# Patient Record
Sex: Male | Born: 1960 | Race: White | Hispanic: No | Marital: Married | Smoking: Never smoker
Health system: Southern US, Community
[De-identification: ages and names within clinical notes are randomized; demographics above are authoritative.]

## PROBLEM LIST (undated history)

## (undated) DIAGNOSIS — F419 Anxiety disorder, unspecified: Secondary | ICD-10-CM

## (undated) DIAGNOSIS — I10 Essential (primary) hypertension: Secondary | ICD-10-CM

## (undated) DIAGNOSIS — M1990 Unspecified osteoarthritis, unspecified site: Secondary | ICD-10-CM

## (undated) DIAGNOSIS — F32A Depression, unspecified: Secondary | ICD-10-CM

## (undated) DIAGNOSIS — E782 Mixed hyperlipidemia: Secondary | ICD-10-CM

## (undated) DIAGNOSIS — Z87442 Personal history of urinary calculi: Secondary | ICD-10-CM

## (undated) DIAGNOSIS — C801 Malignant (primary) neoplasm, unspecified: Secondary | ICD-10-CM

## (undated) DIAGNOSIS — E119 Type 2 diabetes mellitus without complications: Secondary | ICD-10-CM

## (undated) DIAGNOSIS — F329 Major depressive disorder, single episode, unspecified: Secondary | ICD-10-CM

## (undated) DIAGNOSIS — K219 Gastro-esophageal reflux disease without esophagitis: Secondary | ICD-10-CM

## (undated) HISTORY — DX: Mixed hyperlipidemia: E78.2

## (undated) HISTORY — DX: Essential (primary) hypertension: I10

---

## 2000-06-14 HISTORY — PX: PARTIAL NEPHRECTOMY: SHX414

## 2002-07-31 ENCOUNTER — Inpatient Hospital Stay (HOSPITAL_COMMUNITY): Admission: EM | Admit: 2002-07-31 | Discharge: 2002-08-01 | Payer: Self-pay | Admitting: Cardiology

## 2006-06-14 HISTORY — PX: CHOLECYSTECTOMY: SHX55

## 2006-06-14 HISTORY — PX: SHOULDER SURGERY: SHX246

## 2009-03-07 ENCOUNTER — Ambulatory Visit: Payer: Self-pay | Admitting: Cardiovascular Disease

## 2009-06-14 DIAGNOSIS — E119 Type 2 diabetes mellitus without complications: Secondary | ICD-10-CM | POA: Insufficient documentation

## 2009-06-14 HISTORY — DX: Type 2 diabetes mellitus without complications: E11.9

## 2009-08-21 ENCOUNTER — Ambulatory Visit (HOSPITAL_COMMUNITY): Admission: RE | Admit: 2009-08-21 | Discharge: 2009-08-21 | Payer: Self-pay | Admitting: Urology

## 2010-01-08 ENCOUNTER — Ambulatory Visit (HOSPITAL_COMMUNITY): Admission: RE | Admit: 2010-01-08 | Discharge: 2010-01-08 | Payer: Self-pay | Admitting: Urology

## 2010-01-29 ENCOUNTER — Ambulatory Visit (HOSPITAL_COMMUNITY): Admission: RE | Admit: 2010-01-29 | Discharge: 2010-01-29 | Payer: Self-pay | Admitting: Urology

## 2010-03-30 DIAGNOSIS — F32A Depression, unspecified: Secondary | ICD-10-CM | POA: Insufficient documentation

## 2010-03-30 DIAGNOSIS — M47817 Spondylosis without myelopathy or radiculopathy, lumbosacral region: Secondary | ICD-10-CM | POA: Insufficient documentation

## 2010-03-30 DIAGNOSIS — F329 Major depressive disorder, single episode, unspecified: Secondary | ICD-10-CM | POA: Insufficient documentation

## 2010-03-30 HISTORY — DX: Spondylosis without myelopathy or radiculopathy, lumbosacral region: M47.817

## 2010-08-28 LAB — CBC
HCT: 36.2 % — ABNORMAL LOW (ref 39.0–52.0)
Hemoglobin: 12.6 g/dL — ABNORMAL LOW (ref 13.0–17.0)
MCH: 31.5 pg (ref 26.0–34.0)
MCHC: 34.7 g/dL (ref 30.0–36.0)
MCV: 90.7 fL (ref 78.0–100.0)
Platelets: 228 10*3/uL (ref 150–400)
RBC: 3.99 MIL/uL — ABNORMAL LOW (ref 4.22–5.81)
RDW: 12.1 % (ref 11.5–15.5)
WBC: 2.4 10*3/uL — ABNORMAL LOW (ref 4.0–10.5)

## 2010-08-28 LAB — PROTIME-INR
INR: 0.95 (ref 0.00–1.49)
Prothrombin Time: 12.9 seconds (ref 11.6–15.2)

## 2010-08-28 LAB — APTT: aPTT: 28 seconds (ref 24–37)

## 2010-08-29 LAB — CBC
Hemoglobin: 13.8 g/dL (ref 13.0–17.0)
Platelets: 281 10*3/uL (ref 150–400)
RBC: 4.29 MIL/uL (ref 4.22–5.81)
WBC: 5.3 10*3/uL (ref 4.0–10.5)

## 2010-08-29 LAB — APTT: aPTT: 29 seconds (ref 24–37)

## 2010-08-29 LAB — PROTIME-INR
INR: 1.01 (ref 0.00–1.49)
Prothrombin Time: 13.2 seconds (ref 11.6–15.2)

## 2010-08-29 LAB — GLUCOSE, CAPILLARY: Glucose-Capillary: 98 mg/dL (ref 70–99)

## 2010-09-07 LAB — PROTIME-INR
INR: 0.95 (ref 0.00–1.49)
Prothrombin Time: 12.6 seconds (ref 11.6–15.2)

## 2010-09-07 LAB — APTT: aPTT: 32 seconds (ref 24–37)

## 2010-09-07 LAB — CBC
HCT: 40.5 % (ref 39.0–52.0)
Platelets: 303 10*3/uL (ref 150–400)
RDW: 12.3 % (ref 11.5–15.5)

## 2010-10-27 NOTE — Assessment & Plan Note (Signed)
[facility]                        [facility] OFFICE NOTE   ZERICK, PREVETTE                         MRN:          [id number]  DATE:03/07/2009                            DOB:          12-Aug-1960    CHIEF COMPLAINT:  Chest tightness.   HISTORY OF PRESENT ILLNESS:  Mr. Frederick Martinez is a 50 year old white male with  past medical history significant for diabetes, hypertension,  gastroesophageal reflux disease, and family history of premature  coronary artery disease, who is presenting with chest discomfort x1  week.  The patient states that last Friday after coming in after having  emotional distressful event, he had acute onset of substernal chest  discomfort radiating to his left arm associated with diaphoresis and  shortness of breath.  He reported to [facility] Emergency Room.  In the  emergency room, he ruled out for a myocardial infarction and had a CT  scan of his chest that was negative for pulmonary embolism.  He also had  a CT scan of his head that showed no acute intracranial process.  The  patient states that his symptoms persisted throughout the emergency room  visit.  Over the past week, the patient states that he has felt very  weak.  He continues to have persistent intermittent chest discomfort  associated with very low energy levels.  The patient states the chest  discomfort seems to be made worse by lying down and better by sitting  up.  He describes it as tightness.  The patient appears to have had an  extensive cardiac workup in the past including 3 left heart  catheterizations, all revealing normal coronary arteries, and a normal  stress test within the past several months.   PAST MEDICAL HISTORY:  As above.  In addition, the patient states that  he has had his esophagus dilated in the past secondary to problems of  swallowing.   SOCIAL HISTORY:  No tobacco.  Occasional alcohol use.   FAMILY HISTORY:  Positive for premature  coronary artery disease.   ALLERGIES:  No known drug allergies.   MEDICATIONS:  1. Hydrochlorothiazide 25 mg daily.  2. Metoprolol succinate 50 mg daily.  3. Clonazepam 0.5 b.i.d.  4. Metformin 500 mg b.i.d.  5. Venlafaxine 300 mg daily.  6. Gabapentin 300 mg b.i.d.  7. Trilipix 135 mg daily.  8. Lovaza 4 g daily.  9. Trazodone 50 mg every night.  10.Omeprazole 40 mg daily.  11.Azithromycin 250 mg daily as part of the Z-Pak prescribed for      bronchitis.  12.He also uses hydrocodone p.r.n.   REVIEW OF SYSTEMS:  Positive for occasional hematochezia.  He has had a  recent EGD and colonoscopy that showed no source for GI bleeding.  He  also endorses intermittent chills, increased fatigue, and weakness over  the past week.  He denies any lower extremity edema, PND, or orthopnea.  He also denies any syncopal episodes.  Other systems as in HPI,  otherwise negative.   PHYSICAL EXAMINATION:  VITAL SIGNS:  Blood pressure is 132/73, pulse is  64, temperature is 98.8, and  O2 sat is 97% on room air.  GENERAL:  He is in no acute distress.  HEENT:  Normocephalic, atraumatic.  NECK:  Supple.  There is no JVD.  There are no carotid bruits.  HEART:  Regular rate and rhythm without murmur, rub, or gallop.  LUNGS:  Clear bilaterally.  ABDOMEN:  Soft, nontender, nondistended.  EXTREMITIES:  Without edema.  SKIN:  Warm and dry.  No evidence of rash.  MUSCULOSKELETAL:  Bilateral upper and lower extremity strength 5/5.  Pulses 2+ bilateral, carotid, radial, and posterior tibial pulses.  PSYCHIATRIC:  Appropriate with normal levels of insight.   Review of labs drawn at [facility], dated September 18 show a  sodium of 136, potassium 3.9, chloride 97, CO2 of 27, BUN 14, creatinine  0.8, glucose 100, and calcium is 10.  LFTs were completely within normal  limits including an alk phos of 49, AST of 26, and ALT of 32.  CK is  184.  Troponin was 0.  TSH dated September 20 was 4.04.  Hemoglobin  A1c  was 5.7.  Blood gas was 7.39/43/79 on room air.  CT scan of the chest  showed no pulmonary embolism, mild bibasilar atelectasis in the left  vertebral artery originating directly from the aortic arch.  CT scan of  the head showed no acute intracranial pathology and mild mucosal  thickening within the sphenoid sinus.  His EKG was read and normal at  that time.  EKG from today independently interpreted by myself  demonstrates normal sinus rhythm, it is a normal EKG.   ASSESSMENT:  A 50 year old white male with a family history of premature  coronary artery disease, hypertension, and very well-controlled  diabetes, who is presenting with chest discomfort, but has had several  negative cardiac workups in the past.  The differential for chest  discomfort includes GI (esophageal spasm, esophageal stricture,  gastroesophageal reflux disease), anxiety as the initial chest  discomfort was initially provoked through anxiety, psychological (as the  initial event of chest pressure was provoked by emotionally labile  situation), cardiac (vasospasm, CAD, effusion), musculoskeletal.   PLAN:  We will need to obtain the records of the patient's past  cardiovascular workup which includes a normal left heart catheterization  within the past year or 2 and a stress test that was normal within the  past several months per the patient.  If the patient indeed did have a  normal left heart catheterization within a year or 2 and a negative  stress test within the past few months, I feel very comfortable stating  that his chest discomfort is not from obstructive coronary artery  disease.  If the patient has not had a transthoracic echocardiogram to  evaluate his heart structure and function, we will order this.  We  recommend that he continue the medications as  listed above.  In addition for this time period, we asked him to begin  treatment with aspirin 81  mg daily.  We will contact the patient once  results of his previous  studies have been obtained.  The patient requested a note for missing  work and this was given to him.     Brayton El, MD  Electronically Signed    SGA/MedQ  DD: 03/07/2009  DT: 03/08/2009  Job #: [id number]

## 2010-10-30 NOTE — Cardiovascular Report (Signed)
Frederick Martinez, Frederick Martinez                            ACCOUNT NO.:  [id number]   MEDICAL RECORD NO.:  [id number]                   PATIENT TYPE:  INP   LOCATION:  4740                                 FACILITY:  [facility]   PHYSICIAN:  Charlies Constable, M.D. [facility]              DATE OF BIRTH:  [birth year]   DATE OF PROCEDURE:  08/01/2002  DATE OF DISCHARGE:                              CARDIAC CATHETERIZATION   HISTORY:  The patient is a 50 years old and had a previous history of a  normal catheterization in 1998.  He was admitted to [facility]  recently with chest pain and had a Cardiolite scan which suggested possible  anterior ischemia, and an ejection fraction of 45%.  Because of these  findings, he was referred for further evaluation.   PROCEDURES PERFORMED:  1. Left heart catheterization.  2. Selective coronary angiography.  3. Left ventriculography.   PROCEDURE:  The procedure was performed via the right femoral artery with an  arterial sheath and 6-French preformed coronary catheters.  A front-walled  femoral arterial punch was performed and Omnipaque contrast was used.  At  the completion of the diagnostic study, we closed the right femoral artery  with Perclose.  The patient tolerated the procedure well, and left the  laboratory in satisfactory condition.   RESULTS:   CORONARY ANGIOGRAPHY:  1. Left main coronary artery:  The left main coronary is free of significant     disease.  2. Left anterior descending:  The left anterior descending artery gave rise     to two diagonal branches and two septal perforators.  There was slight     irregularity in the proximal LAD, but no major obstruction.  3. Circumflex.  The circumflex artery gave rise to an atrial branch, two     marginal branches, and a small AV branch.  These branches were free of     significant disease.  4. Right coronary artery:  The right coronary artery is a moderately large     vessel that gave rise to a conus  branch, right ventricular branch,     posterior descending branch, and posterolateral branch.  These vessels     are free of significant disease.   LEFT VENTRICULOGRAM:  Left ventriculogram was performed in the RAO  projection and showed very mild global hypokinesis with an estimated  ejection fraction was 50%.   HEMODYNAMIC DATA:  The aortic pressure was 112/72 with a mean of 89 and left  ventricular pressure was 112/14.   CONCLUSION:  1. Normal coronary angiography.  2. Very mild global depression of left ventricular function.   RECOMMENDATIONS:  The patient has no evidence of destructive coronary artery  disease, and I think his recent symptoms were not ischemic.  He does have  minimal depression of his global left ventricular. Function, and his  ejection fraction by his recent Cardiolite scan was 45%.  He does have  moderate alcohol, and this could be related.  It is now recommended that he  discontinue this.  We will plan to discharge him later today with followup  with Dr.  Arville Care, who I spoke with today.  He still needs secondary risk factor  modification since he has strong positive family history, and elevated  lipids, and we will send him home on aspirin and Lipitor in addition to his  previous medicines of Effexor and Nexium.                                               Charlies Constable, M.D. [facility]    BB/MEDQ  D:  08/01/2002  T:  08/01/2002  Job:  [id number]   cc:   Dr. Janine Limbo, [place], [place]   Dr. Jackquline Bosch, [place], [place]   [facility]

## 2010-10-30 NOTE — Letter (Signed)
March 12, 2009    [facility]  [address]  [place], [place] [address]   RE:  Frederick Martinez, Frederick Martinez  MRN:  [id number]  /  DOB:  06-24-1960   Dear Frederick Martinez,   I have been unable to reach you by telephone.  We have reviewed your  cardiac records, confirmed the 3 negative heart catheterizations and the  negative stress test from June 2010.  These findings make the chest pain  you are experiencing unlikely to be cardiac in origin.  We would like to  check a transthoracic echocardiogram in order to evaluate your current  cardiac structure and function.  If you wish to proceed with this study,  please call our office at [phone].  Also, please feel free to contact  Frederick Martinez with any questions or concerns.    Sincerely,      Brayton El, MD  Electronically Signed    SGA/MedQ  DD: 03/12/2009  DT: 03/12/2009  Job #: [id number]

## 2010-10-30 NOTE — Letter (Signed)
March 13, 2009    Brent Bulla, MD  [address]  [place], [place] [address]   RE:  TEONDRE, JAROSZ  MRN:  [id number]  /  DOB:  [birth year]   Dear Dr. Marina Goodell:   I am writing to you regarding your patient Latavion Halls.  In my  evaluation of him it appears that he has a history of chest pain that  has led to negative cardiovascular workups in the past.  He again is  having similar symptoms of chest discomfort.  My review of his record  includes three normal left heart catheterizations and a negative stress  test within the past year.  At this point I see no indication to repeat  an ischemic workup.  I would like to check a transthoracic  echocardiogram to evaluate the patient's cardiac structure function and  rule out pericardial effusion.  I have tried to contact the patient on  multiple occasions, but unfortunately have not been able to reach him.  I have sent him a letter to this effect and asked him to call our office  if he wishes to have this study ordered.   Thank you for the referral of this patient and please contact my office  with any questions or concerns.    Sincerely,      Brayton El, MD  Electronically Signed    SGA/MedQ  DD: 03/13/2009  DT: 03/13/2009  Job #: [id number]

## 2010-10-30 NOTE — H&P (Signed)
NAMENYLEN, CREQUE                            ACCOUNT NO.:  [id number]   MEDICAL RECORD NO.:  [id number]                   PATIENT TYPE:  INP   LOCATION:  4740                                 FACILITY:  [facility]   PHYSICIAN:  Thomas C. Wall, M.D. [facility]            DATE OF BIRTH:  04/28/1961   DATE OF ADMISSION:  07/31/2002  DATE OF DISCHARGE:                                HISTORY & PHYSICAL   CHIEF COMPLAINT:  The patient is a 50 year old gentleman who is referred up  for an Adenosine-Cardiolite and chest pain.   HISTORY OF PRESENT ILLNESS:  He is a 50 year old married white male, father  of three children, who comes up tonight for cardiac catheterization.  He was  admitted on July 30, 2002 with a two-day history of chest pressure  obtained in his right arm.  He also had some throbbing headaches.  CT scan  of his head was negative.  Echocardiogram and chest x-ray were normal.  Cardiac enzymes were normal.   He had an Adenosine-Cardiolite that showed some mild anterior ischemia with  mild generalized hypokinesia with an ejection fraction of 45%.   PAST MEDICAL HISTORY:  He had catheterization about eight years ago here  which was negative per his recollection.  He has had a history of  postoperative depression for a left nephrectomy for renal carcinoma.  He has  a history of gastroesophageal reflux.  He has hyperlipidemia but was not on  a statin prior to admission.  He is on Lipitor now.   MEDICATIONS:  1. Effexor 150 mg q.d.  2. Nexium 40 mg q.d.   CARDIAC RISK FACTORS:  His cardiac risk factors are pertinent for sex, very  positive family history, and hyperlipidemia.  He had one blood sugar that  was elevated [facility] and we will check a fasting blood sugar  in the morning.   SOCIAL HISTORY:  He lives in [place].  He is separated.  He has no tobacco  history.  He uses moderate alcohol.   FAMILY HISTORY:  Remarkable for a brother who had cardiac bypass  surgery in  his 33s.  His father is alive and had heart troubles early on as well.   REVIEW OF SYMPTOMS:  Unremarkable other than HPI.   PHYSICAL EXAMINATION:  GENERAL:  He appears to be in no acute distress.  He  seems to have somewhat of a flat affect.  VITAL SIGNS:  Blood pressure is 136/78, pulse is 57 and regular, temperature  is 98.3, respiratory rate is 18.  He weighs 215.  HEENT:  Unremarkable.  NECK:  Good carotid upstrokes without bruits.  There is no JVD.  He has no  thyroid enlargement.  CARDIOVASCULAR:  Regular rate and rhythm without murmur.  He has normal S1  and S2.  LUNGS:  Clear.  SKIN:  Warm and dry.  ABDOMEN:  Soft with good bowel  sounds.  He has no tenderness present.  PULSES:  Intact.  EXTREMITIES:  There is no edema.  NEUROLOGICAL:  Intact.   LABORATORY DATA:  Chest x-ray shows no acute cardiopulmonary disease from  [place].  EKG shows sinus bradycardia with a rate of 57, T-wave inversion,  essentially normal.   ASSESSMENT/PLAN:  Chest discomfort consistent with angina:  He has a  positive stress Cardiolite.  He has a very positive family history of  hyperlipidemia.  Plan is catheterization. Indications, risks, and potential  benefits have been discussed with the patient.  They agreed to proceed.                                               Thomas C. Daleen Squibb, M.D. [facility]    TCW/MEDQ  D:  07/31/2002  T:  08/01/2002  Job:  [id number]   cc:   Danae Orleans. Venetia Maxon, M.D.  [address]  [place]  [place] [address]  Fax: [phone]   Doreen Beam  [address]  [place]  [place] [address]  Fax: [phone]

## 2010-10-30 NOTE — Letter (Signed)
March 10, 2009    Brent Bulla, MD  [facility]  [address], [place] [address]   RE:  Frederick Martinez, Frederick Martinez  MRN:  [id number]  /  DOB:  December 23, 1960   Dear Dr. Marina Goodell:   I am writing to you regarding your patient, Calel Pisarski.  As you know  he is a 50 year old white male with multiple risk factors for coronary  disease who has had worsening chest discomfort for the past week.  He  had reported to [facility] emergency department at which time he had  negative troponins and a CT scan of the chest was negative for pulmonary  embolism.  My review of the patient's history and medical records  indicates that he has had three left heart catheterizations  in the past  that have revealed normal coronary arteries and he has had a stress test  in June of this year that showed an ejection fraction of 54% and no  evidence of myocardial ischemia.  The patient states that at times his  chest discomfort is provoked by emotionally labile situations.  Because  of his negative extensive cardiac workup in the past, I think it  unlikely that the chest discomfort he is experiencing now is secondary  to obstructive coronary disease.  We will order a transthoracic  echocardiogram to evaluate the patient's cardiac structure and to rule  out any pericardial disease.  If the chest pain were to continue I would  consider instituting therapy with a calcium channel blocker as coronary  spasm is in the differential.  I hope that you are happy with this  approach and I look forward to following this patient along with you.    Sincerely,      Brayton El, MD  Electronically Signed    SGA/MedQ  DD: 03/10/2009  DT: 03/10/2009  Job #: [id number]

## 2010-10-30 NOTE — Discharge Summary (Signed)
Frederick Martinez, Frederick Martinez                            ACCOUNT NO.:  [id number]   MEDICAL RECORD NO.:  [id number]                   PATIENT TYPE:  INP   LOCATION:  4740                                 FACILITY:  [facility]   PHYSICIAN:  Escalante Bing, M.D. [facility]           DATE OF BIRTH:  Sep 24, 1960   DATE OF ADMISSION:  07/31/2002  DATE OF DISCHARGE:  08/01/2002                           DISCHARGE SUMMARY - REFERRING   PROCEDURES:  1. Cardiac catheterization.  2. Coronary arteriogram.  3. Left ventriculogram.  4. Perclose right groin.   HOSPITAL COURSE:  The patient is a 50 year old male with no known history of  coronary artery disease.  He went to the hospital in [place] for chest  pain.  He ruled out for a MI, but he had a Cardiolite which showed an EF of  45% and ischemia.  It was felt that he needed cardiac catheterization to  further evaluate him.  This was performed on August 01, 2002.   The cardiac catheterization showed slight irregularities in the LAD but only  minimal disease in the LAD system as well as the circumflex and RCA systems.  His EF was approximately 50%.  He had Perclose to his right groin and was  placed on bedrest for two hours.  The films were evaluated by Dr. Riley Kill  who felt that he could follow up with Dr. Arville Care and Dr. Sherril Croon.  He had  aspirin and Lipitor added to his medication regimen but, otherwise, no  changes.  Pending completion of bedrest and ambulation with no changes in  his groin, he is considered stable for discharge on August 01, 2002.   LABORATORY DATA:  Sodium 138, potassium 4.2, chloride 30, BUN 10, creatinine  0.0.  INR 0.8, PTT 33.   DISCHARGE CONDITION:  Stable.   DISCHARGE DIAGNOSES:  1. Chest pain, abnormal Cardiolite, but cardiac catheterization showing no     significant coronary artery disease.  2. History of headaches with a negative head CT.  3. Strong family history of premature coronary artery disease.  4. Gastroesophageal  reflux disease.  5. Depression.  6. Hyperlipidemia.  7. History of partial nephrectomy secondary to cancer.   DISCHARGE INSTRUCTIONS:  1. His activity level is to include no driving U98 hours, and he can remove     the Perclose dressing after that time and shower.  2. He is to call for an appointment with Dr. Sherril Croon, M.D. and also follow up     with his primary care physician, Dr. Arville Care.   DISCHARGE MEDICATIONS:  1. Effexor 150 mg daily.  2.     Nexium 40 mg daily.  3. Aspirin 81 mg daily.  4. Lipitor 10 mg daily.     Frederick Martinez, P.A.-C, [facility]            [place] Bing, M.D. [facility]    RRG/MEDQ  D:  08/01/2002  T:  08/01/2002  Job:  [id number]   cc:   Trecia Rogers. Arville Care, M.D., [place], [place]   Earl Many, M.D., [place], [place]

## 2011-01-30 IMAGING — CR DG ABDOMEN 1V
1 series · 1 of 1 positions shown · non-contrast
Comparison: 01/08/2010

CLINICAL DATA: Lithotripsy

ABDOMEN - 1 VIEW

[t abdomen supine]
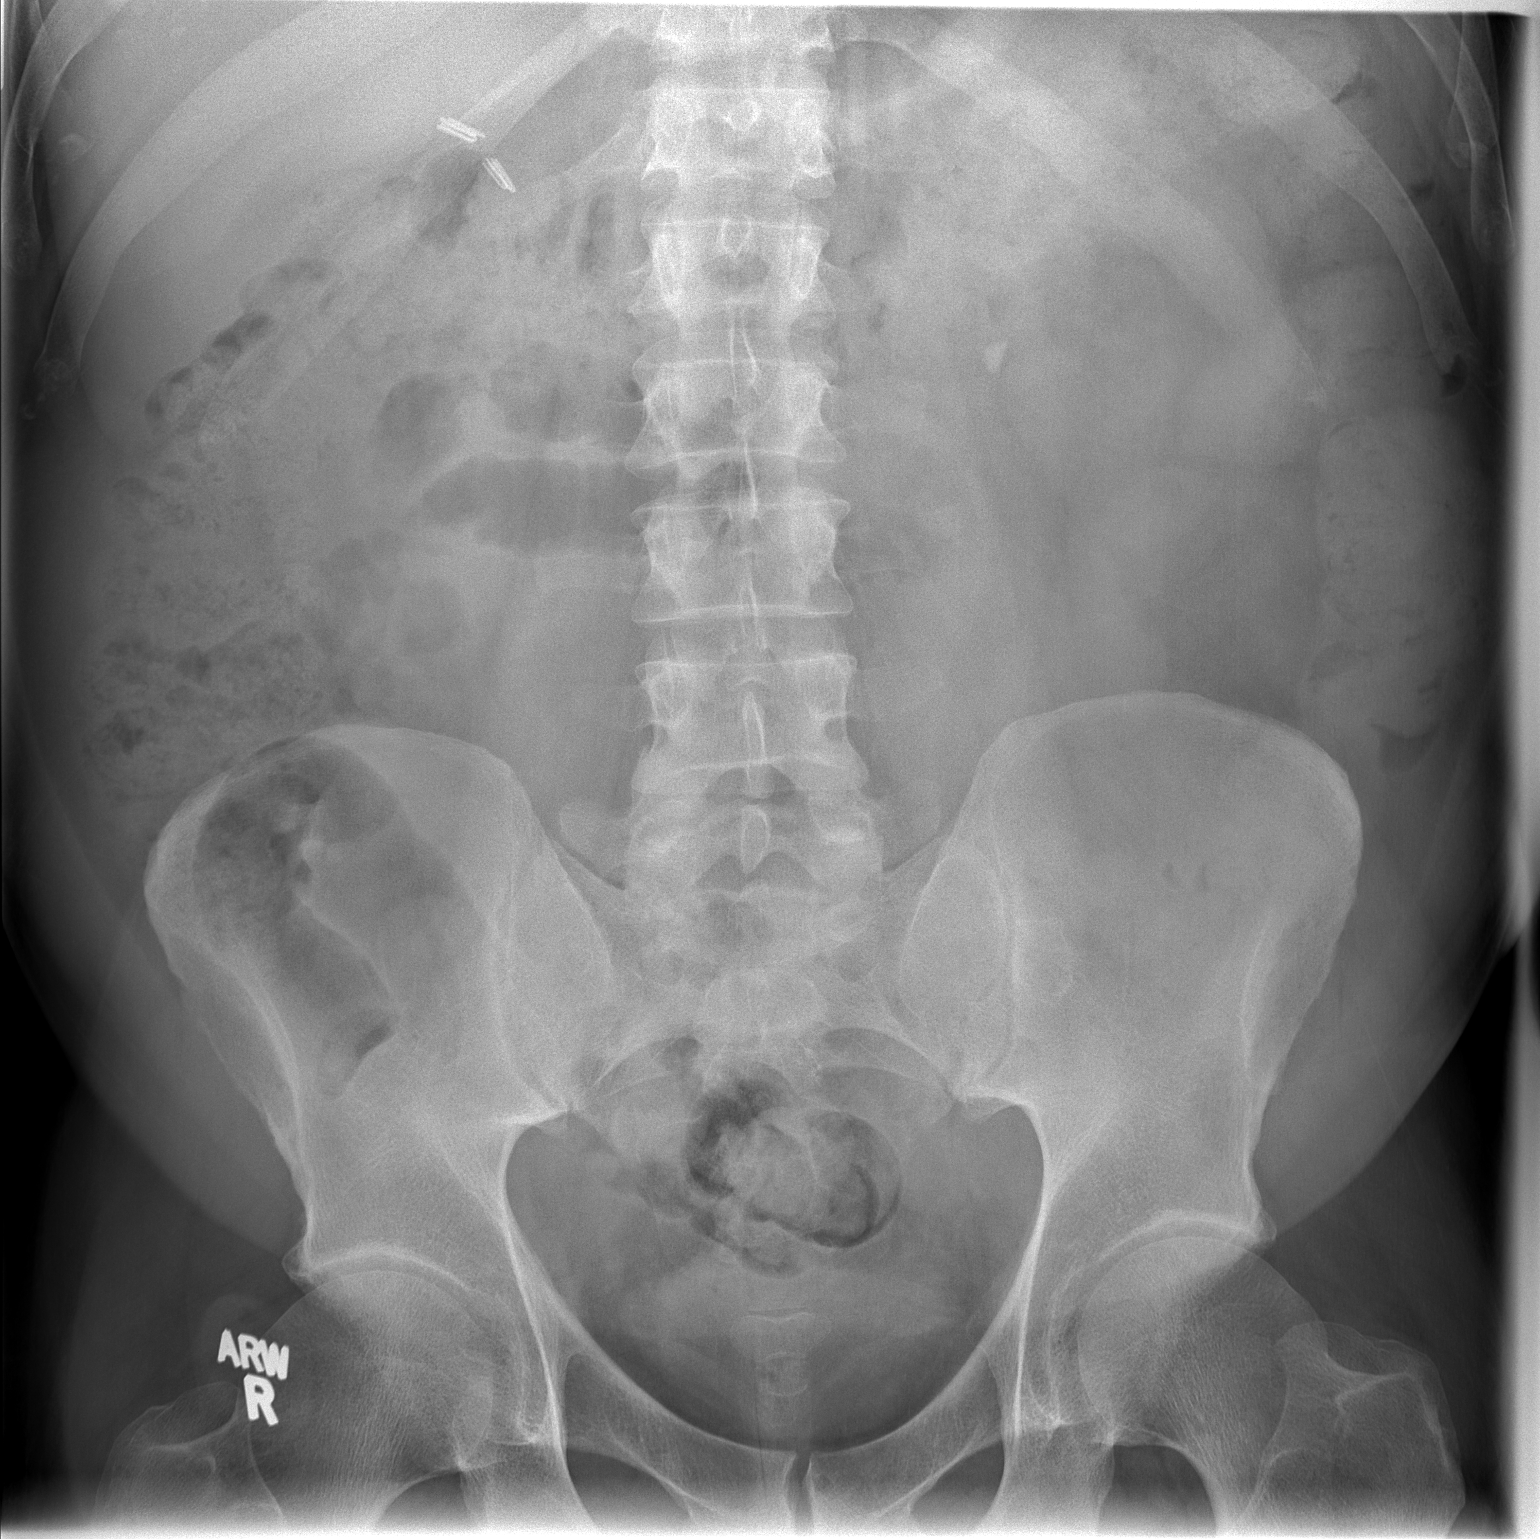

[1 of 1 positions shown; findings below may reference images not displayed]

FINDINGS: Left renal calculus is stable in size and now projects
over the left renal pelvis.  Right ureteral stent has been removed.
Nonobstructive bowel gas pattern. Right ureteral calculi are
resolved
IMPRESSION: Right ureteral stent removal.

Left nephrolithiasis.

Nonobstructive bowel gas pattern.

Resolved right ureteral calculi.

## 2011-03-01 ENCOUNTER — Encounter: Payer: Self-pay | Admitting: Cardiovascular Disease

## 2011-03-11 ENCOUNTER — Encounter: Payer: Self-pay | Admitting: Family Medicine

## 2011-03-30 ENCOUNTER — Encounter: Payer: Self-pay | Admitting: Cardiovascular Disease

## 2012-12-25 DIAGNOSIS — G4486 Cervicogenic headache: Secondary | ICD-10-CM | POA: Insufficient documentation

## 2014-03-26 ENCOUNTER — Encounter (HOSPITAL_COMMUNITY): Payer: Self-pay | Admitting: *Deleted

## 2014-03-28 ENCOUNTER — Ambulatory Visit (HOSPITAL_COMMUNITY)
Admission: RE | Admit: 2014-03-28 | Discharge: 2014-03-28 | Disposition: A | Discharge status: Home / Self Care | Payer: Commercial Managed Care - PPO | Source: Ambulatory Visit | Attending: Urology | Admitting: Urology

## 2014-03-28 ENCOUNTER — Encounter (HOSPITAL_COMMUNITY)
Admission: RE | Disposition: A | Discharge status: Home / Self Care | Payer: Self-pay | Source: Ambulatory Visit | Attending: Urology

## 2014-03-28 ENCOUNTER — Encounter (HOSPITAL_COMMUNITY): Payer: Self-pay | Admitting: *Deleted

## 2014-03-28 ENCOUNTER — Ambulatory Visit (HOSPITAL_COMMUNITY): Payer: Commercial Managed Care - PPO

## 2014-03-28 DIAGNOSIS — N201 Calculus of ureter: Secondary | ICD-10-CM | POA: Diagnosis not present

## 2014-03-28 HISTORY — DX: Depression, unspecified: F32.A

## 2014-03-28 HISTORY — DX: Major depressive disorder, single episode, unspecified: F32.9

## 2014-03-28 HISTORY — DX: Personal history of urinary calculi: Z87.442

## 2014-03-28 HISTORY — DX: Malignant (primary) neoplasm, unspecified: C80.1

## 2014-03-28 HISTORY — DX: Type 2 diabetes mellitus without complications: E11.9

## 2014-03-28 HISTORY — DX: Unspecified osteoarthritis, unspecified site: M19.90

## 2014-03-28 HISTORY — DX: Anxiety disorder, unspecified: F41.9

## 2014-03-28 HISTORY — DX: Gastro-esophageal reflux disease without esophagitis: K21.9

## 2014-03-28 LAB — CBC
HCT: 36.9 % — ABNORMAL LOW (ref 39.0–52.0)
HEMOGLOBIN: 12.8 g/dL — AB (ref 13.0–17.0)
MCH: 29.9 pg (ref 26.0–34.0)
MCHC: 34.7 g/dL (ref 30.0–36.0)
MCV: 86.2 fL (ref 78.0–100.0)
Platelets: 242 10*3/uL (ref 150–400)
RBC: 4.28 MIL/uL (ref 4.22–5.81)
RDW: 12.1 % (ref 11.5–15.5)
WBC: 5.2 10*3/uL (ref 4.0–10.5)

## 2014-03-28 LAB — PROTIME-INR
INR: 1.04 (ref 0.00–1.49)
Prothrombin Time: 13.7 seconds (ref 11.6–15.2)

## 2014-03-28 LAB — GLUCOSE, CAPILLARY: Glucose-Capillary: 106 mg/dL — ABNORMAL HIGH (ref 70–99)

## 2014-03-28 LAB — APTT: aPTT: 29 seconds (ref 24–37)

## 2014-03-28 SURGERY — LITHOTRIPSY, ESWL
Anesthesia: LOCAL

## 2014-03-28 MED ORDER — DIPHENHYDRAMINE HCL 25 MG PO CAPS
25.0000 mg | ORAL_CAPSULE | ORAL | Status: AC
  Administered 2014-03-28: 25 mg via ORAL
  Filled 2014-03-28: qty 1

## 2014-03-28 MED ORDER — DIAZEPAM 5 MG PO TABS
10.0000 mg | ORAL_TABLET | ORAL | Status: AC
  Administered 2014-03-28: 10 mg via ORAL
  Filled 2014-03-28: qty 2

## 2014-03-28 MED ORDER — DEXTROSE IN LACTATED RINGERS 5 % IV SOLN
INTRAVENOUS | Status: DC
  Administered 2014-03-28: 16:00:00 via INTRAVENOUS

## 2014-03-28 MED ORDER — CIPROFLOXACIN IN D5W 400 MG/200ML IV SOLN
400.0000 mg | INTRAVENOUS | Status: AC
  Administered 2014-03-28: 400 mg via INTRAVENOUS
  Filled 2014-03-28: qty 200

## 2014-03-28 NOTE — Discharge Instructions (Signed)
No change to your home medications.   Follow [facility] Discharge Instructions.

## 2016-06-20 DIAGNOSIS — J0100 Acute maxillary sinusitis, unspecified: Secondary | ICD-10-CM | POA: Diagnosis not present

## 2016-07-09 DIAGNOSIS — I10 Essential (primary) hypertension: Secondary | ICD-10-CM | POA: Diagnosis not present

## 2016-07-09 DIAGNOSIS — E782 Mixed hyperlipidemia: Secondary | ICD-10-CM | POA: Diagnosis not present

## 2016-07-09 DIAGNOSIS — E1142 Type 2 diabetes mellitus with diabetic polyneuropathy: Secondary | ICD-10-CM | POA: Diagnosis not present

## 2016-08-09 ENCOUNTER — Emergency Department (HOSPITAL_COMMUNITY)
Admission: EM | Admit: 2016-08-09 | Discharge: 2016-08-10 | Disposition: A | Discharge status: Home / Self Care | Payer: Commercial Managed Care - PPO | Attending: Emergency Medicine | Admitting: Emergency Medicine

## 2016-08-09 ENCOUNTER — Emergency Department (HOSPITAL_COMMUNITY): Payer: Commercial Managed Care - PPO

## 2016-08-09 ENCOUNTER — Encounter (HOSPITAL_COMMUNITY): Payer: Self-pay

## 2016-08-09 DIAGNOSIS — E119 Type 2 diabetes mellitus without complications: Secondary | ICD-10-CM | POA: Insufficient documentation

## 2016-08-09 DIAGNOSIS — Z7984 Long term (current) use of oral hypoglycemic drugs: Secondary | ICD-10-CM | POA: Insufficient documentation

## 2016-08-09 DIAGNOSIS — R519 Headache, unspecified: Secondary | ICD-10-CM

## 2016-08-09 DIAGNOSIS — M542 Cervicalgia: Secondary | ICD-10-CM | POA: Diagnosis not present

## 2016-08-09 DIAGNOSIS — S199XXA Unspecified injury of neck, initial encounter: Secondary | ICD-10-CM | POA: Diagnosis not present

## 2016-08-09 DIAGNOSIS — G44309 Post-traumatic headache, unspecified, not intractable: Secondary | ICD-10-CM

## 2016-08-09 DIAGNOSIS — M5412 Radiculopathy, cervical region: Secondary | ICD-10-CM | POA: Insufficient documentation

## 2016-08-09 DIAGNOSIS — Y999 Unspecified external cause status: Secondary | ICD-10-CM | POA: Insufficient documentation

## 2016-08-09 DIAGNOSIS — R51 Headache: Secondary | ICD-10-CM | POA: Diagnosis present

## 2016-08-09 DIAGNOSIS — F0781 Postconcussional syndrome: Secondary | ICD-10-CM | POA: Insufficient documentation

## 2016-08-09 DIAGNOSIS — Z85528 Personal history of other malignant neoplasm of kidney: Secondary | ICD-10-CM | POA: Diagnosis not present

## 2016-08-09 DIAGNOSIS — Y929 Unspecified place or not applicable: Secondary | ICD-10-CM | POA: Diagnosis not present

## 2016-08-09 DIAGNOSIS — W01198A Fall on same level from slipping, tripping and stumbling with subsequent striking against other object, initial encounter: Secondary | ICD-10-CM | POA: Insufficient documentation

## 2016-08-09 DIAGNOSIS — Y939 Activity, unspecified: Secondary | ICD-10-CM | POA: Insufficient documentation

## 2016-08-09 MED ORDER — METHOCARBAMOL 750 MG PO TABS: 750.0000 mg | ORAL_TABLET | Freq: Three times a day (TID) | ORAL | 0 refills | Status: DC

## 2016-08-09 MED ORDER — METHOCARBAMOL 1000 MG/10ML IJ SOLN
1000.0000 mg | Freq: Once | INTRAMUSCULAR | Status: DC
  Filled 2016-08-09: qty 10

## 2016-08-09 MED ORDER — TRAMADOL HCL 50 MG PO TABS: 50.0000 mg | ORAL_TABLET | Freq: Four times a day (QID) | ORAL | 0 refills | Status: DC | PRN

## 2016-08-09 MED ORDER — METOCLOPRAMIDE HCL 5 MG/ML IJ SOLN
10.0000 mg | Freq: Once | INTRAMUSCULAR | Status: AC
  Administered 2016-08-09: 10 mg via INTRAVENOUS
  Filled 2016-08-09: qty 2

## 2016-08-09 MED ORDER — METHOCARBAMOL 1000 MG/10ML IJ SOLN
1000.0000 mg | Freq: Once | INTRAVENOUS | Status: AC
  Administered 2016-08-09: 1000 mg via INTRAVENOUS
  Filled 2016-08-09: qty 10

## 2016-08-09 MED ORDER — KETOROLAC TROMETHAMINE 30 MG/ML IJ SOLN
30.0000 mg | Freq: Once | INTRAMUSCULAR | Status: AC
  Administered 2016-08-09: 30 mg via INTRAVENOUS
  Filled 2016-08-09: qty 1

## 2016-08-09 MED ORDER — SODIUM CHLORIDE 0.9 % IV SOLN
Freq: Once | INTRAVENOUS | Status: AC
  Administered 2016-08-09: 23:00:00 via INTRAVENOUS

## 2016-08-09 MED ORDER — PREDNISONE 20 MG PO TABS: ORAL_TABLET | ORAL | 0 refills | Status: DC

## 2016-08-09 MED ORDER — DIPHENHYDRAMINE HCL 50 MG/ML IJ SOLN
12.5000 mg | Freq: Once | INTRAMUSCULAR | Status: AC
  Administered 2016-08-09: 12.5 mg via INTRAVENOUS
  Filled 2016-08-09: qty 1

## 2016-08-09 NOTE — ED Triage Notes (Signed)
Pt fell about 1 month ago and hit head on ice. Since then pt has been having progressively worse headache. Pt states pain radiates from base of skull, down entire spine, and down left arm. PT states little relief with aleve. [facility] was [facility] at [facility] p[facility] and had records faxed here. (at pod b sec. Desk). No LOC at time of fall. Pt endorses blurred vision at this time with dizziness.

## 2016-08-09 NOTE — Discharge Instructions (Signed)
You've been given prescriptions for prednisone taper.  Please take this as directed until all tablets have been completed.  This is a very potent anti-inflammatory, so do not take any ibuprofen, Naprosyn or Advil with this lipid given a muscle relaxer by the name, upper back, some please take this as directed.  He also have received a prescription for Ultram.  This is for pain control.  Please uses as needed for severe pain.  Make an appointment with your primary care physician for follow-up

## 2016-08-09 NOTE — ED Provider Notes (Signed)
[facility] DEPT Provider Note   CSN: [id number] Arrival date & time: 08/09/16  1841     History   Chief Complaint Chief Complaint  Patient presents with  . Headache    HPI Frederick Martinez is a 56 y.o. male.  This a 56 year old obese male with a history of chronic pain issues currently are all of his pain medicines.  He states he fell a month ago slipping on the ice.  He initially had a headache for 3 days that got better, but has had a continuous headache since which is inconsistent with his initial statement of a resolution.  She's on Saturday, 3 days ago that he had worsening headache with radiation of pain to his left arm.  He also states that last week he was hanging sheet rock and painting, which exacerbated his pain. He states he seen his primary care physician in the past month.  He was taking Aleve for his headache pain, but was told to stop taking this and was given a short-term prescription for Naprosyn which he finished 2 days ago.  His PCP suggested x-rays, but because of his work schedule.  He was unable to follow through, he is unsure of exactly which x-rays would've been ordered.       Past Medical History:  Diagnosis Date  . Anxiety   . Arthritis   . Cancer [facility])    kidney cancer  . Depression   . 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 GERD (gastroesophageal reflux disease)   . History of kidney stones     There are no active problems to display for this patient.   Past Surgical History:  Procedure Laterality Date  . CHOLECYSTECTOMY  2008  . PARTIAL NEPHRECTOMY  2002   kidney cancer  . SHOULDER SURGERY  2008       Home Medications    Prior to Admission medications   Medication Sig Start Date End Date Taking? Authorizing Provider  amitriptyline (ELAVIL) 10 MG tablet Take 10 mg by mouth at bedtime.    Historical Provider, MD  amLODipine (NORVASC) 10 MG tablet Take 10 mg by mouth daily.    Historical Provider, MD    clonazePAM (KLONOPIN) 0.5 MG tablet Take 0.5 mg by mouth 2 (two) times daily as needed.     Historical Provider, MD  dexlansoprazole (DEXILANT) 60 MG capsule Take 60 mg by mouth daily.    Historical Provider, MD  hydrochlorothiazide (HYDRODIURIL) 25 MG tablet Take 12.5 mg by mouth daily.     Historical Provider, MD  HYDROcodone-acetaminophen (VICODIN) 5-500 MG per tablet Take 1 tablet by mouth every 6 (six) hours as needed.      Historical Provider, MD  Linaclotide Rolan Lipa) 290 MCG CAPS capsule Take 290 mcg by mouth daily.    Historical Provider, MD  metFORMIN (GLUCOPHAGE) 500 MG tablet Take 500 mg by mouth 2 (two) times daily with a meal.      Historical Provider, MD  methocarbamol (ROBAXIN) 750 MG tablet Take 1 tablet (750 mg total) by mouth 3 (three) times daily. 08/09/16   Junius Creamer, NP  metoprolol (TOPROL-XL) 50 MG 24 hr tablet Take 50 mg by mouth daily.     Historical Provider, MD  nabumetone (RELAFEN) 750 MG tablet Take 750 mg by mouth 2 (two) times daily.    Historical Provider, MD  omega-3 acid ethyl esters (LOVAZA) 1 G capsule Take 2 g by mouth 2 (two) times daily.  Historical Provider, MD  oxymorphone (OPANA ER) 15 MG 12 hr tablet Take 15 mg by mouth 3 (three) times daily.    Historical Provider, MD  Pitavastatin Calcium (LIVALO) 4 MG TABS Take by mouth.    Historical Provider, MD  predniSONE (DELTASONE) 20 MG tablet 3 Tabs PO Days 1-3, then 2 tabs PO Days 4-6, then 1 tab PO Day 7-9, then Half Tab PO Day 10-12 08/09/16   Junius Creamer, NP  pregabalin (LYRICA) 150 MG capsule Take 150 mg by mouth 2 (two) times daily.    Historical Provider, MD  ramelteon (ROZEREM) 8 MG tablet Take 8 mg by mouth at bedtime.    Historical Provider, MD  tamsulosin (FLOMAX) 0.4 MG CAPS capsule Take 0.4 mg by mouth.    Historical Provider, MD  traMADol (ULTRAM) 50 MG tablet Take 1 tablet (50 mg total) by mouth every 6 (six) hours as needed. 08/09/16   Junius Creamer, NP  traZODone (DESYREL) 50 MG tablet Take 50  mg by mouth at bedtime.     Historical Provider, MD  venlafaxine (EFFEXOR-XR) 150 MG 24 hr capsule Take 150 mg by mouth daily.     Historical Provider, MD    Family History Family History  Problem Relation Age of Onset  . Heart disease      Social History Social History  Substance Use Topics  . Smoking status: Never Smoker  . Smokeless tobacco: Never Used  . Alcohol use Yes     Comment: has not drank in one year.     Allergies   Patient has no known allergies.   Review of Systems Review of Systems  Constitutional: Negative for fever.  Musculoskeletal: Positive for arthralgias and neck pain.  Neurological: Positive for numbness and headaches.  All other systems reviewed and are negative.    Physical Exam Updated Vital Signs BP 108/76   Pulse (!) 46   Temp 98.1 F (36.7 C) (Oral)   Resp 18   Ht 6' (1.829 m)   Wt 116.6 kg   SpO2 98%   BMI 34.86 kg/m   Physical Exam  Constitutional: He is oriented to person, place, and time. He appears well-developed and well-nourished. No distress.  HENT:  Head: Normocephalic.  Eyes: Pupils are equal, round, and reactive to light.  Neck: Normal range of motion. Spinous process tenderness and muscular tenderness present. No neck rigidity. Normal range of motion present.    Increased pain to the left arm with movement of his neck to the right  Cardiovascular: Normal rate.   Pulmonary/Chest: Effort normal.  Musculoskeletal: He exhibits tenderness.       Back:  Neurological: He is oriented to person, place, and time.  Skin: Skin is warm.  Nursing note and vitals reviewed.    ED Treatments / Results  Labs (all labs ordered are listed, but only abnormal results are displayed) Labs Reviewed - No data to display  EKG  EKG Interpretation None       Radiology Ct Head Wo Contrast  Result Date: 08/09/2016 CLINICAL DATA:  Increasing headache, blurry vision for 1 month EXAM: CT HEAD WITHOUT CONTRAST TECHNIQUE:  Contiguous axial images were obtained from the base of the skull through the vertex without intravenous contrast. COMPARISON:  None. FINDINGS: Brain: No evidence of acute infarction, hemorrhage, hydrocephalus, extra-axial collection or mass lesion/mass effect. Vascular: No hyperdense vessel or unexpected calcification. Skull: No osseous abnormality. Sinuses/Orbits: Visualized paranasal sinuses are clear. Visualized mastoid sinuses are clear. Visualized orbits demonstrate no focal  abnormality. Other: None IMPRESSION: No acute intracranial pathology. Electronically Signed   By: Kathreen Devoid   On: 08/09/2016 20:35   Ct Cervical Spine Wo Contrast  Result Date: 08/09/2016 CLINICAL DATA:  [place] 1 month ago, struck head on ice. Persistent headache and craniocervical pain radiating to spine and LEFT arm. EXAM: CT CERVICAL SPINE WITHOUT CONTRAST TECHNIQUE: Multidetector CT imaging of the cervical spine was performed without intravenous contrast. Multiplanar CT image reconstructions were also generated. COMPARISON:  MRI of the cervical spine November 18, 2012 FINDINGS: ALIGNMENT: Straightened lordosis. Vertebral bodies in alignment. SKULL BASE AND VERTEBRAE: Cervical vertebral bodies and posterior elements are intact. Intervertebral disc heights preserved. No destructive bony lesions. C1-2 articulation maintained, mild arthropathy. SOFT TISSUES AND SPINAL CANAL: Nonacute. Partially imaged probable partially empty sella. DISC LEVELS: Similar small C4-5 disc protrusion without canal stenosis. No osseous neural foraminal narrowing. UPPER CHEST: Lung apices are clear. OTHER: None. IMPRESSION: No acute fracture or malalignment. No CT findings of neurocompression. Electronically Signed   By: Elon Alas M.D.   On: 08/09/2016 22:47    Procedures Procedures (including critical care time)  Medications Ordered in ED Medications  0.9 %  sodium chloride infusion ( Intravenous Stopped 08/09/16 2359)  metoCLOPramide (REGLAN)  injection 10 mg (10 mg Intravenous Given 08/09/16 2250)  diphenhydrAMINE (BENADRYL) injection 12.5 mg (12.5 mg Intravenous Given 08/09/16 2250)  ketorolac (TORADOL) 30 MG/ML injection 30 mg (30 mg Intravenous Given 08/09/16 2249)  methocarbamol (ROBAXIN) 1,000 mg in dextrose 5 % 50 mL IVPB (0 mg Intravenous Stopped 08/09/16 2359)     Initial Impression / Assessment and Plan / ED Course  I have reviewed the triage vital signs and the nursing notes.  Pertinent labs & imaging results that were available during my care of the patient were reviewed by me and considered in my medical decision making (see chart for details).    Review of CT scans reveals normal pathology.  CT scan of his neck shows he has a small disc protrusion at C4-5 without canal stenosis.  He was given IV Reglan, IV Benadryl and IV, Toradol, as well as IV Robaxin for his discomfort.  Attempts discharge.  She is feeling significantly better.  He was given instructions and information on postconcussive syndrome as well as prescriptions for prednisone taper, Ultram and Robaxin with instructions to follow-up with his PCP.    Final Clinical Impressions(s) / ED Diagnoses   Final diagnoses:  Bad headache  Post-concussion headache  Cervical radiculopathy at C5    New Prescriptions New Prescriptions   PREDNISONE (DELTASONE) 20 MG TABLET    3 Tabs PO Days 1-3, then 2 tabs PO Days 4-6, then 1 tab PO Day 7-9, then Half Tab PO Day 10-12   TRAMADOL (ULTRAM) 50 MG TABLET    Take 1 tablet (50 mg total) by mouth every 6 (six) hours as needed.     Junius Creamer, NP 08/10/16 0001    Blanchie Dessert, MD 08/10/16 [phone]

## 2016-08-09 NOTE — ED Notes (Signed)
Fax sent from [facility]; at [place] desk

## 2016-08-10 NOTE — ED Notes (Signed)
Pt stable, ambulatory, states understanding of discharge instructions 

## 2016-08-12 DIAGNOSIS — M546 Pain in thoracic spine: Secondary | ICD-10-CM | POA: Diagnosis not present

## 2016-08-12 DIAGNOSIS — M5412 Radiculopathy, cervical region: Secondary | ICD-10-CM | POA: Diagnosis not present

## 2016-08-12 DIAGNOSIS — R51 Headache: Secondary | ICD-10-CM | POA: Diagnosis not present

## 2016-08-21 DIAGNOSIS — L03113 Cellulitis of right upper limb: Secondary | ICD-10-CM | POA: Diagnosis not present

## 2016-08-21 DIAGNOSIS — M50223 Other cervical disc displacement at C6-C7 level: Secondary | ICD-10-CM | POA: Diagnosis not present

## 2016-08-21 DIAGNOSIS — M4802 Spinal stenosis, cervical region: Secondary | ICD-10-CM | POA: Diagnosis not present

## 2016-08-21 DIAGNOSIS — M5412 Radiculopathy, cervical region: Secondary | ICD-10-CM | POA: Diagnosis not present

## 2016-08-26 DIAGNOSIS — I803 Phlebitis and thrombophlebitis of lower extremities, unspecified: Secondary | ICD-10-CM | POA: Diagnosis not present

## 2016-09-02 DIAGNOSIS — M542 Cervicalgia: Secondary | ICD-10-CM | POA: Diagnosis not present

## 2016-09-18 DIAGNOSIS — R140 Abdominal distension (gaseous): Secondary | ICD-10-CM | POA: Diagnosis not present

## 2016-10-12 DIAGNOSIS — S30860A Insect bite (nonvenomous) of lower back and pelvis, initial encounter: Secondary | ICD-10-CM | POA: Diagnosis not present

## 2016-10-12 DIAGNOSIS — S40862A Insect bite (nonvenomous) of left upper arm, initial encounter: Secondary | ICD-10-CM | POA: Diagnosis not present

## 2016-10-12 DIAGNOSIS — A932 Colorado tick fever: Secondary | ICD-10-CM | POA: Diagnosis not present

## 2016-10-12 DIAGNOSIS — S70362A Insect bite (nonvenomous), left thigh, initial encounter: Secondary | ICD-10-CM | POA: Diagnosis not present

## 2016-10-14 DIAGNOSIS — E119 Type 2 diabetes mellitus without complications: Secondary | ICD-10-CM | POA: Diagnosis not present

## 2016-10-14 DIAGNOSIS — I10 Essential (primary) hypertension: Secondary | ICD-10-CM | POA: Diagnosis not present

## 2016-10-14 DIAGNOSIS — E782 Mixed hyperlipidemia: Secondary | ICD-10-CM | POA: Diagnosis not present

## 2016-10-14 DIAGNOSIS — E1142 Type 2 diabetes mellitus with diabetic polyneuropathy: Secondary | ICD-10-CM | POA: Diagnosis not present

## 2016-10-29 DIAGNOSIS — R42 Dizziness and giddiness: Secondary | ICD-10-CM | POA: Diagnosis not present

## 2016-10-29 DIAGNOSIS — M79602 Pain in left arm: Secondary | ICD-10-CM | POA: Diagnosis not present

## 2016-11-20 DIAGNOSIS — S30860A Insect bite (nonvenomous) of lower back and pelvis, initial encounter: Secondary | ICD-10-CM | POA: Diagnosis not present

## 2016-11-20 DIAGNOSIS — A932 Colorado tick fever: Secondary | ICD-10-CM | POA: Diagnosis not present

## 2017-01-14 DIAGNOSIS — E1142 Type 2 diabetes mellitus with diabetic polyneuropathy: Secondary | ICD-10-CM | POA: Diagnosis not present

## 2017-01-14 DIAGNOSIS — E782 Mixed hyperlipidemia: Secondary | ICD-10-CM | POA: Diagnosis not present

## 2017-01-14 DIAGNOSIS — I10 Essential (primary) hypertension: Secondary | ICD-10-CM | POA: Diagnosis not present

## 2017-02-01 DIAGNOSIS — D6489 Other specified anemias: Secondary | ICD-10-CM | POA: Diagnosis not present

## 2017-02-01 DIAGNOSIS — R200 Anesthesia of skin: Secondary | ICD-10-CM | POA: Diagnosis not present

## 2017-02-01 DIAGNOSIS — R42 Dizziness and giddiness: Secondary | ICD-10-CM | POA: Diagnosis not present

## 2017-02-27 DIAGNOSIS — J209 Acute bronchitis, unspecified: Secondary | ICD-10-CM | POA: Diagnosis not present

## 2017-04-09 DIAGNOSIS — S93402A Sprain of unspecified ligament of left ankle, initial encounter: Secondary | ICD-10-CM | POA: Diagnosis not present

## 2017-04-22 DIAGNOSIS — E1142 Type 2 diabetes mellitus with diabetic polyneuropathy: Secondary | ICD-10-CM | POA: Diagnosis not present

## 2017-04-22 DIAGNOSIS — E782 Mixed hyperlipidemia: Secondary | ICD-10-CM | POA: Diagnosis not present

## 2017-04-22 DIAGNOSIS — I10 Essential (primary) hypertension: Secondary | ICD-10-CM | POA: Diagnosis not present

## 2017-04-22 DIAGNOSIS — Z23 Encounter for immunization: Secondary | ICD-10-CM | POA: Diagnosis not present

## 2017-07-01 DIAGNOSIS — R21 Rash and other nonspecific skin eruption: Secondary | ICD-10-CM | POA: Diagnosis not present

## 2017-07-01 DIAGNOSIS — L600 Ingrowing nail: Secondary | ICD-10-CM | POA: Diagnosis not present

## 2017-07-01 DIAGNOSIS — I10 Essential (primary) hypertension: Secondary | ICD-10-CM | POA: Diagnosis not present

## 2017-07-01 DIAGNOSIS — R0789 Other chest pain: Secondary | ICD-10-CM | POA: Diagnosis not present

## 2017-07-29 DIAGNOSIS — E1142 Type 2 diabetes mellitus with diabetic polyneuropathy: Secondary | ICD-10-CM | POA: Diagnosis not present

## 2017-07-29 DIAGNOSIS — E782 Mixed hyperlipidemia: Secondary | ICD-10-CM | POA: Diagnosis not present

## 2017-07-29 DIAGNOSIS — H52221 Regular astigmatism, right eye: Secondary | ICD-10-CM | POA: Diagnosis not present

## 2017-07-29 DIAGNOSIS — H5201 Hypermetropia, right eye: Secondary | ICD-10-CM | POA: Diagnosis not present

## 2017-07-29 DIAGNOSIS — I10 Essential (primary) hypertension: Secondary | ICD-10-CM | POA: Diagnosis not present

## 2017-07-29 DIAGNOSIS — E119 Type 2 diabetes mellitus without complications: Secondary | ICD-10-CM | POA: Diagnosis not present

## 2017-08-08 DIAGNOSIS — L600 Ingrowing nail: Secondary | ICD-10-CM | POA: Diagnosis not present

## 2017-08-10 IMAGING — CT CT HEAD W/O CM
3 series · 15 of 47 positions shown, 18 images · non-contrast
Comparison: None.

CLINICAL DATA: Increasing headache, blurry vision for 1 month

EXAM:
CT HEAD WITHOUT CONTRAST
TECHNIQUE: Contiguous axial images were obtained from the base of the skull
through the vertex without intravenous contrast.

[Series 2: head 5.0 h30s · axial · 0.46mm/px · z∈[-121,+19]mm · 9 of 34 slices shown, 12 images]
[im 3/34  brain]
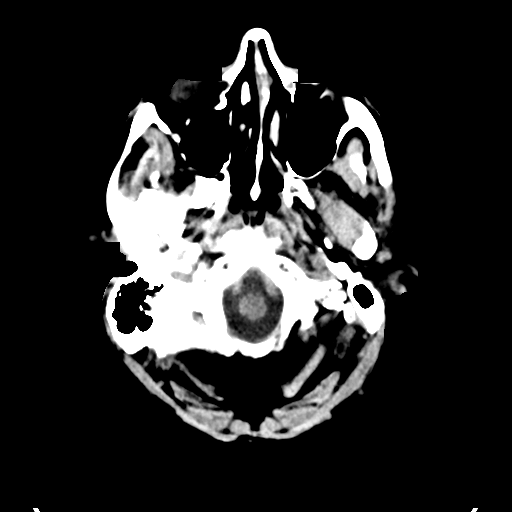
[im 3/34  bone]
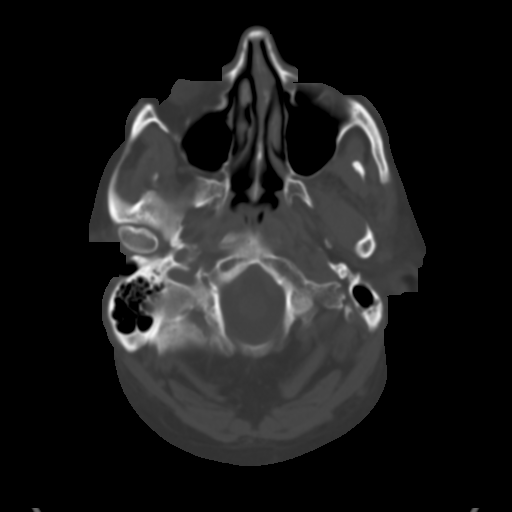
[im 6/34  brain]
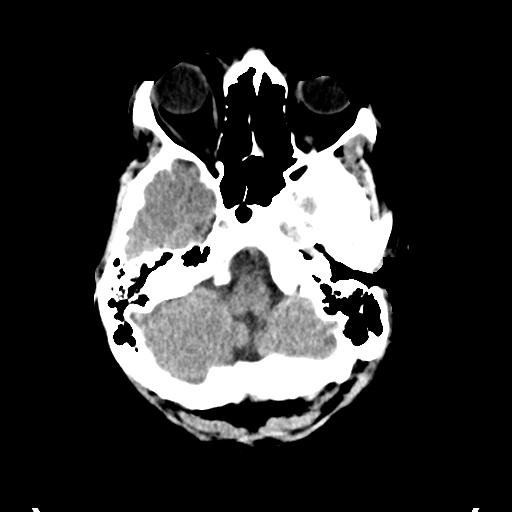
[im 10/34  brain]
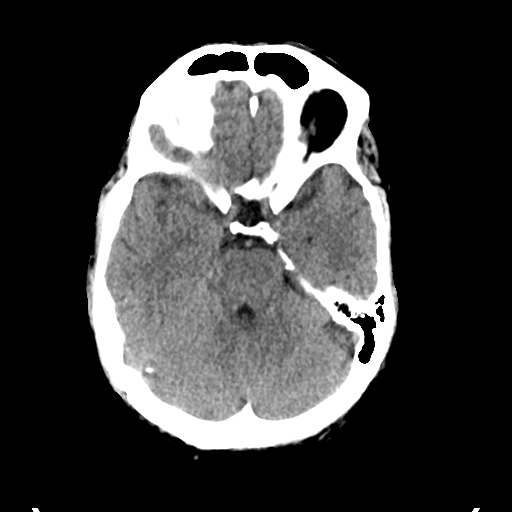
[im 13/34  brain]
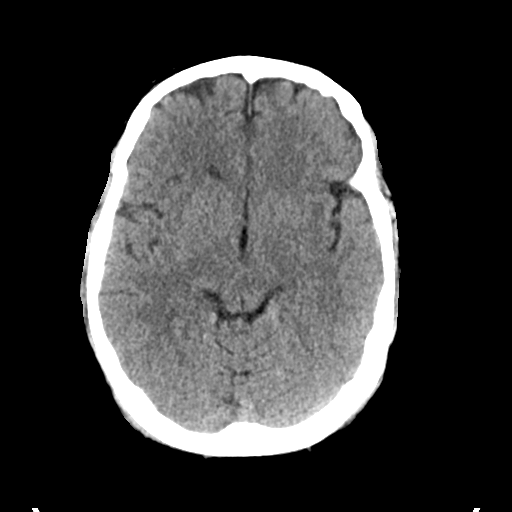
[im 18/34  brain]
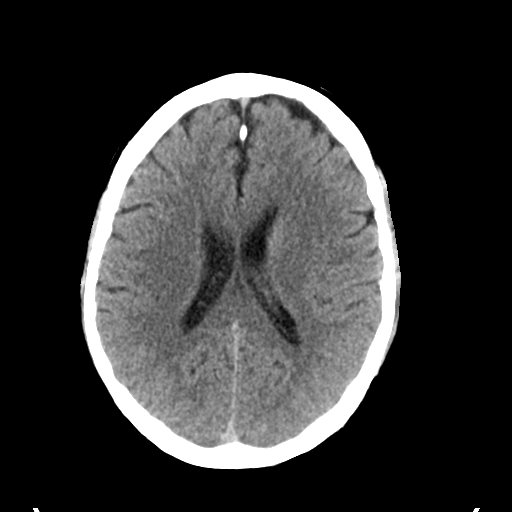
[im 18/34  bone]
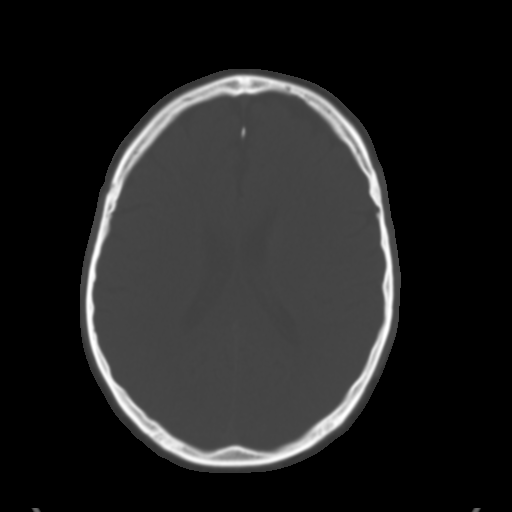
[im 21/34  brain]
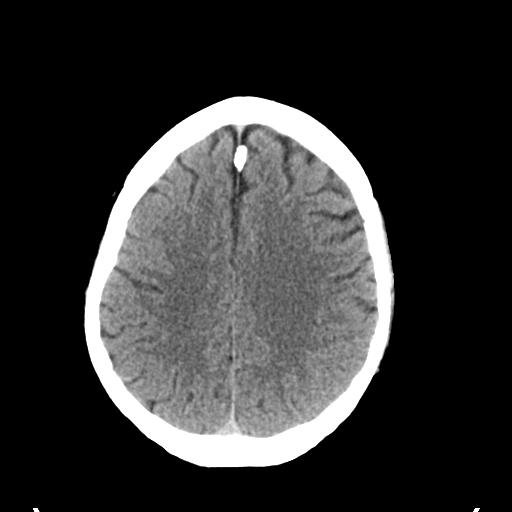
[im 24/34  brain]
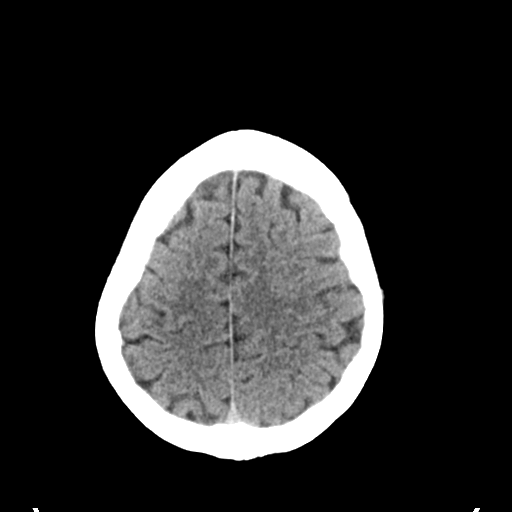
[im 28/34  brain]
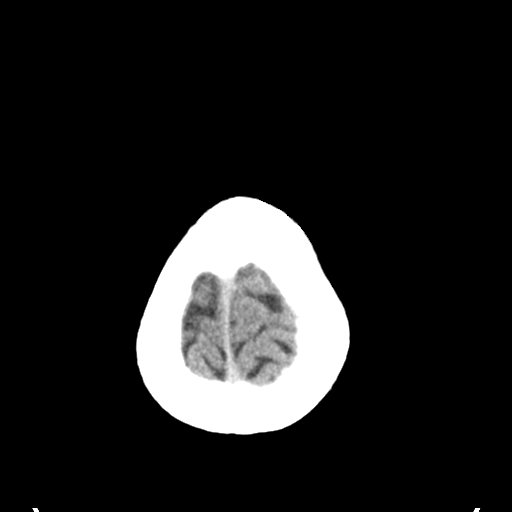
[im 31/34  brain]
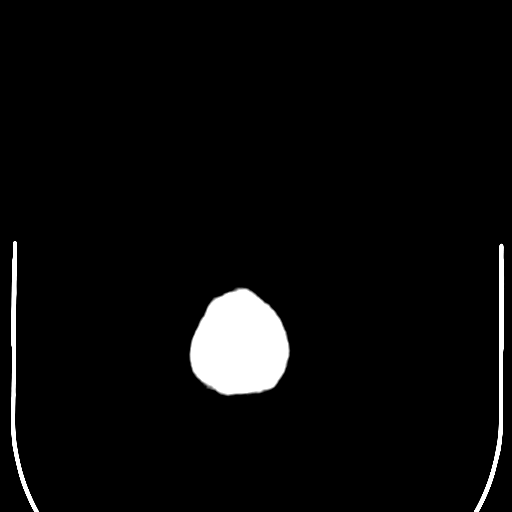
[im 31/34  bone]
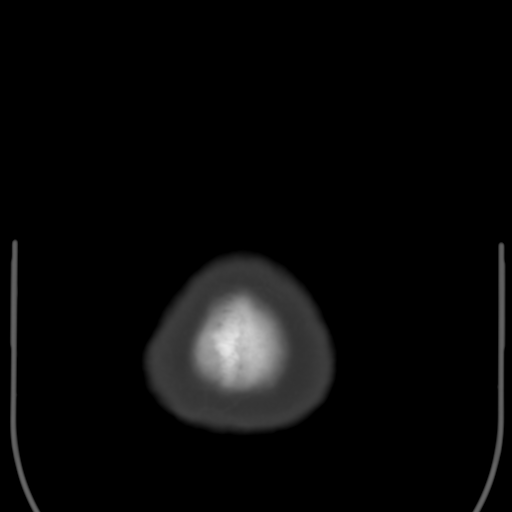

[Series 4: head 3.0 mpr cor · coronal · 0.32mm/px · 3 of 74 slices shown]
[im 25/74  brain]
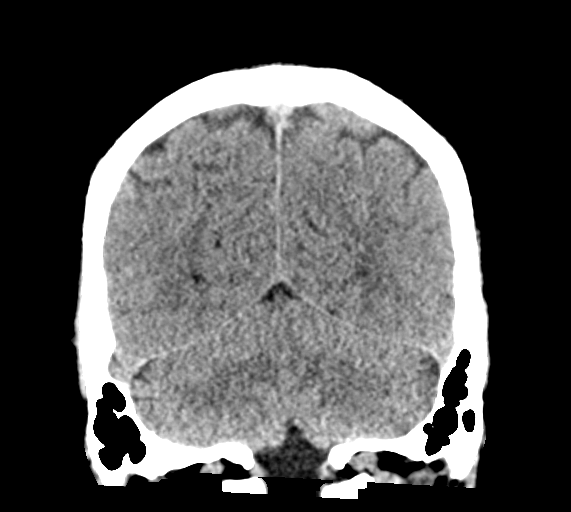
[im 33/74  brain]
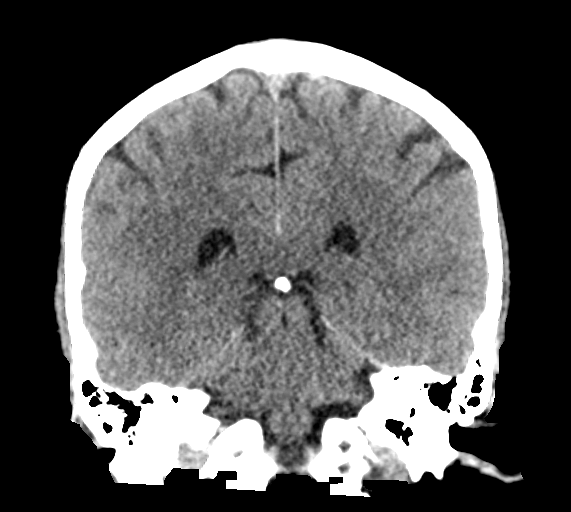
[im 41/74  brain]
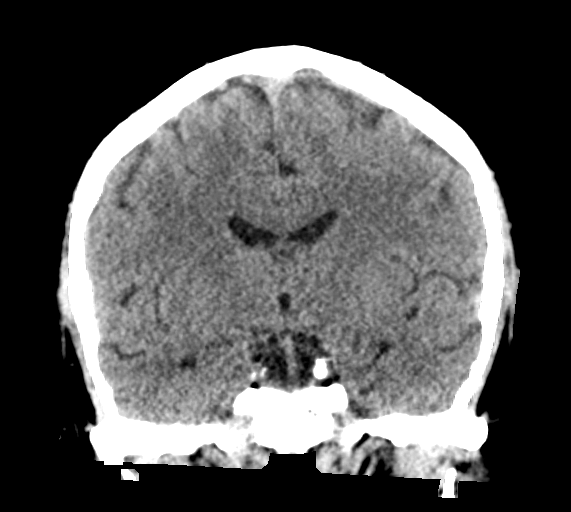

[Series 5: head 3.0 mpr sag · sagittal · 0.33mm/px · 3 of 63 slices shown]
[im 21/63  brain]
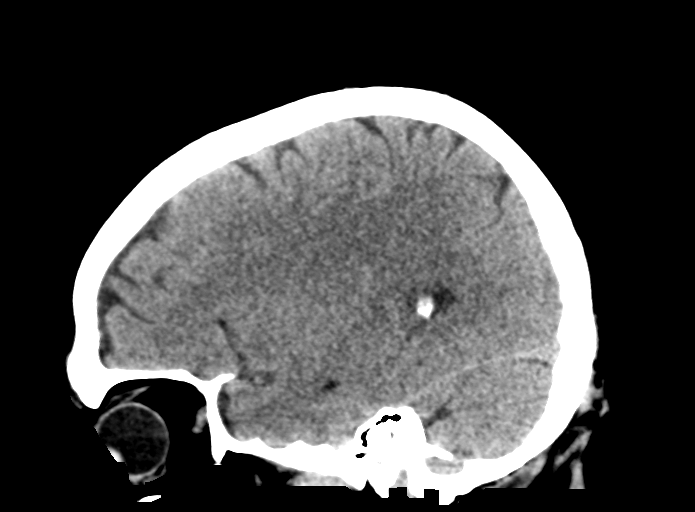
[im 32/63  brain]
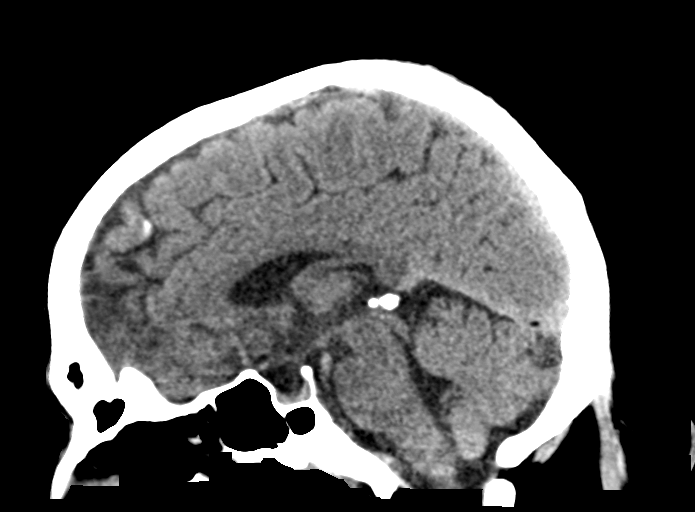
[im 42/63  brain]
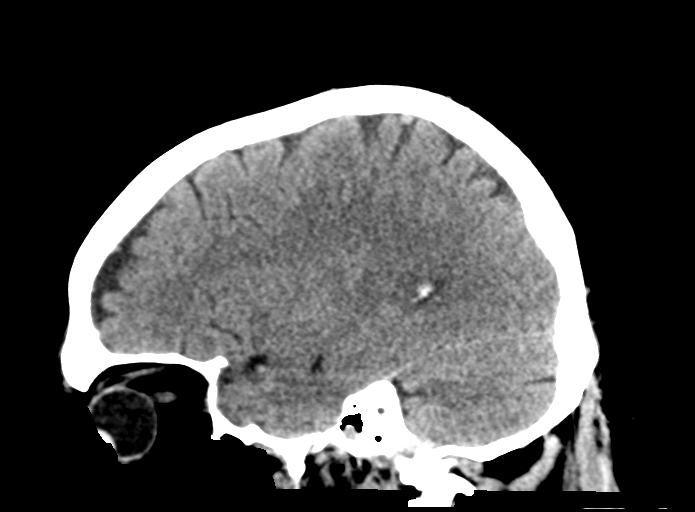

[15 of 47 positions shown; findings below may reference images not displayed]

FINDINGS: Brain: No evidence of acute infarction, hemorrhage, hydrocephalus,
extra-axial collection or mass lesion/mass effect.

Vascular: No hyperdense vessel or unexpected calcification.

Skull: No osseous abnormality.

Sinuses/Orbits: Visualized paranasal sinuses are clear. Visualized
mastoid sinuses are clear. Visualized orbits demonstrate no focal
abnormality.

Other: None
IMPRESSION: No acute intracranial pathology.

## 2017-09-06 DIAGNOSIS — R079 Chest pain, unspecified: Secondary | ICD-10-CM | POA: Diagnosis not present

## 2017-09-06 DIAGNOSIS — R002 Palpitations: Secondary | ICD-10-CM | POA: Diagnosis not present

## 2017-09-06 DIAGNOSIS — E1149 Type 2 diabetes mellitus with other diabetic neurological complication: Secondary | ICD-10-CM

## 2017-09-06 HISTORY — DX: Type 2 diabetes mellitus with other diabetic neurological complication: E11.49

## 2017-09-07 DIAGNOSIS — R002 Palpitations: Secondary | ICD-10-CM | POA: Diagnosis not present

## 2017-09-07 DIAGNOSIS — R079 Chest pain, unspecified: Secondary | ICD-10-CM | POA: Diagnosis not present

## 2017-09-12 DIAGNOSIS — R0789 Other chest pain: Secondary | ICD-10-CM | POA: Diagnosis not present

## 2017-09-12 DIAGNOSIS — R079 Chest pain, unspecified: Secondary | ICD-10-CM | POA: Diagnosis not present

## 2017-09-12 DIAGNOSIS — E119 Type 2 diabetes mellitus without complications: Secondary | ICD-10-CM | POA: Diagnosis not present

## 2017-09-12 DIAGNOSIS — R9431 Abnormal electrocardiogram [ECG] [EKG]: Secondary | ICD-10-CM | POA: Diagnosis not present

## 2017-09-20 DIAGNOSIS — S60561D Insect bite (nonvenomous) of right hand, subsequent encounter: Secondary | ICD-10-CM | POA: Diagnosis not present

## 2017-09-21 DIAGNOSIS — L03011 Cellulitis of right finger: Secondary | ICD-10-CM | POA: Diagnosis not present

## 2017-09-23 DIAGNOSIS — L03011 Cellulitis of right finger: Secondary | ICD-10-CM | POA: Diagnosis not present

## 2017-10-04 DIAGNOSIS — R110 Nausea: Secondary | ICD-10-CM | POA: Diagnosis not present

## 2017-10-04 DIAGNOSIS — R10812 Left upper quadrant abdominal tenderness: Secondary | ICD-10-CM | POA: Diagnosis not present

## 2017-10-04 DIAGNOSIS — R10816 Epigastric abdominal tenderness: Secondary | ICD-10-CM | POA: Diagnosis not present

## 2017-10-04 DIAGNOSIS — R1084 Generalized abdominal pain: Secondary | ICD-10-CM | POA: Diagnosis not present

## 2017-11-01 DIAGNOSIS — E785 Hyperlipidemia, unspecified: Secondary | ICD-10-CM | POA: Diagnosis not present

## 2017-11-01 DIAGNOSIS — E1140 Type 2 diabetes mellitus with diabetic neuropathy, unspecified: Secondary | ICD-10-CM | POA: Diagnosis not present

## 2017-11-01 DIAGNOSIS — R079 Chest pain, unspecified: Secondary | ICD-10-CM | POA: Diagnosis not present

## 2017-11-01 DIAGNOSIS — IMO0001 Reserved for inherently not codable concepts without codable children: Secondary | ICD-10-CM | POA: Insufficient documentation

## 2017-11-03 DIAGNOSIS — R1032 Left lower quadrant pain: Secondary | ICD-10-CM | POA: Diagnosis not present

## 2017-11-03 DIAGNOSIS — R12 Heartburn: Secondary | ICD-10-CM | POA: Diagnosis not present

## 2017-11-03 DIAGNOSIS — K921 Melena: Secondary | ICD-10-CM | POA: Diagnosis not present

## 2017-11-04 DIAGNOSIS — N200 Calculus of kidney: Secondary | ICD-10-CM | POA: Diagnosis not present

## 2017-11-04 DIAGNOSIS — R1013 Epigastric pain: Secondary | ICD-10-CM | POA: Diagnosis not present

## 2017-12-09 DIAGNOSIS — R079 Chest pain, unspecified: Secondary | ICD-10-CM | POA: Diagnosis not present

## 2017-12-09 DIAGNOSIS — R42 Dizziness and giddiness: Secondary | ICD-10-CM | POA: Diagnosis not present

## 2017-12-09 DIAGNOSIS — R110 Nausea: Secondary | ICD-10-CM | POA: Diagnosis not present

## 2017-12-09 DIAGNOSIS — M542 Cervicalgia: Secondary | ICD-10-CM | POA: Diagnosis not present

## 2017-12-09 DIAGNOSIS — R51 Headache: Secondary | ICD-10-CM | POA: Diagnosis not present

## 2017-12-19 DIAGNOSIS — K921 Melena: Secondary | ICD-10-CM | POA: Diagnosis not present

## 2017-12-19 DIAGNOSIS — K219 Gastro-esophageal reflux disease without esophagitis: Secondary | ICD-10-CM | POA: Diagnosis not present

## 2017-12-19 DIAGNOSIS — R1013 Epigastric pain: Secondary | ICD-10-CM | POA: Diagnosis not present

## 2017-12-19 DIAGNOSIS — K3189 Other diseases of stomach and duodenum: Secondary | ICD-10-CM | POA: Diagnosis not present

## 2017-12-19 DIAGNOSIS — K625 Hemorrhage of anus and rectum: Secondary | ICD-10-CM | POA: Diagnosis not present

## 2017-12-19 DIAGNOSIS — K2960 Other gastritis without bleeding: Secondary | ICD-10-CM | POA: Diagnosis not present

## 2017-12-19 LAB — HM COLONOSCOPY

## 2018-02-14 DIAGNOSIS — M545 Low back pain: Secondary | ICD-10-CM | POA: Diagnosis not present

## 2018-02-20 DIAGNOSIS — E1142 Type 2 diabetes mellitus with diabetic polyneuropathy: Secondary | ICD-10-CM | POA: Diagnosis not present

## 2018-02-20 DIAGNOSIS — E782 Mixed hyperlipidemia: Secondary | ICD-10-CM | POA: Diagnosis not present

## 2018-02-20 DIAGNOSIS — I10 Essential (primary) hypertension: Secondary | ICD-10-CM | POA: Diagnosis not present

## 2018-02-22 DIAGNOSIS — I10 Essential (primary) hypertension: Secondary | ICD-10-CM | POA: Diagnosis not present

## 2018-02-22 DIAGNOSIS — Z23 Encounter for immunization: Secondary | ICD-10-CM | POA: Diagnosis not present

## 2018-02-22 DIAGNOSIS — E782 Mixed hyperlipidemia: Secondary | ICD-10-CM | POA: Diagnosis not present

## 2018-02-22 DIAGNOSIS — E1142 Type 2 diabetes mellitus with diabetic polyneuropathy: Secondary | ICD-10-CM | POA: Diagnosis not present

## 2018-03-08 DIAGNOSIS — M545 Low back pain: Secondary | ICD-10-CM | POA: Diagnosis not present

## 2018-03-11 DIAGNOSIS — L0390 Cellulitis, unspecified: Secondary | ICD-10-CM | POA: Diagnosis not present

## 2018-03-11 DIAGNOSIS — A4902 Methicillin resistant Staphylococcus aureus infection, unspecified site: Secondary | ICD-10-CM | POA: Diagnosis not present

## 2018-03-28 DIAGNOSIS — S3992XA Unspecified injury of lower back, initial encounter: Secondary | ICD-10-CM | POA: Diagnosis not present

## 2018-03-28 DIAGNOSIS — M545 Low back pain: Secondary | ICD-10-CM | POA: Diagnosis not present

## 2018-05-25 DIAGNOSIS — J018 Other acute sinusitis: Secondary | ICD-10-CM | POA: Diagnosis not present

## 2018-05-25 DIAGNOSIS — E782 Mixed hyperlipidemia: Secondary | ICD-10-CM | POA: Diagnosis not present

## 2018-05-25 DIAGNOSIS — E1142 Type 2 diabetes mellitus with diabetic polyneuropathy: Secondary | ICD-10-CM | POA: Diagnosis not present

## 2018-05-25 DIAGNOSIS — I10 Essential (primary) hypertension: Secondary | ICD-10-CM | POA: Diagnosis not present

## 2018-08-02 DIAGNOSIS — M25561 Pain in right knee: Secondary | ICD-10-CM | POA: Diagnosis not present

## 2018-08-02 DIAGNOSIS — M25562 Pain in left knee: Secondary | ICD-10-CM | POA: Diagnosis not present

## 2018-08-11 DIAGNOSIS — M25561 Pain in right knee: Secondary | ICD-10-CM | POA: Diagnosis not present

## 2018-08-11 DIAGNOSIS — M25562 Pain in left knee: Secondary | ICD-10-CM | POA: Diagnosis not present

## 2018-09-01 DIAGNOSIS — E782 Mixed hyperlipidemia: Secondary | ICD-10-CM | POA: Diagnosis not present

## 2018-09-01 DIAGNOSIS — I10 Essential (primary) hypertension: Secondary | ICD-10-CM | POA: Diagnosis not present

## 2018-09-01 DIAGNOSIS — E1142 Type 2 diabetes mellitus with diabetic polyneuropathy: Secondary | ICD-10-CM | POA: Diagnosis not present

## 2018-12-18 ENCOUNTER — Encounter: Payer: Self-pay | Admitting: Family Medicine

## 2019-07-04 ENCOUNTER — Telehealth: Payer: Self-pay | Admitting: Nurse Practitioner

## 2019-07-04 NOTE — Telephone Encounter (Signed)
Called to Discuss with patient about Covid symptoms and the use of bamlanivimab, a monoclonal antibody infusion for those with mild to moderate Covid symptoms and at a high risk of hospitalization.     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 Advised pt to call PCP and get a documented test performed that could be scanned into the chart. Advised to call back when this is complete.

## 2019-07-06 ENCOUNTER — Other Ambulatory Visit: Payer: Self-pay | Admitting: Adult Health

## 2019-07-06 DIAGNOSIS — U071 COVID-19: Secondary | ICD-10-CM

## 2019-07-06 NOTE — Progress Notes (Signed)
  I connected by phone with Claire Shown on 07/06/2019 at 1:29 PM to discuss the potential use of an new treatment for mild to moderate COVID-19 viral infection in non-hospitalized patients.  This patient is a 60 y.o. male that meets the FDA criteria for Emergency Use Authorization of bamlanivimab or casirivimab\imdevimab.  Has a (+) direct SARS-CoV-2 viral test result  Has mild or moderate COVID-19   Is ? 57 years of age and weighs ? 40 kg  Is NOT hospitalized due to COVID-19  Is NOT requiring oxygen therapy or requiring an increase in baseline oxygen flow rate due to COVID-19  Is within 10 days of symptom onset  Has at least one of the high risk factor(s) for progression to severe COVID-19 and/or hospitalization as defined in EUA.  Specific high risk criteria : Diabetes 57 years old with HTN  Symptom onset 1/18  I have spoken and communicated the following to the patient or parent/caregiver:  1. FDA has authorized the emergency use of bamlanivimab and casirivimab\imdevimab for the treatment of mild to moderate COVID-19 in adults and pediatric patients with positive results of direct SARS-CoV-2 viral testing who are 23 years of age and older weighing at least 40 kg, and who are at high risk for progressing to severe COVID-19 and/or hospitalization.  2. The significant known and potential risks and benefits of bamlanivimab and casirivimab\imdevimab, and the extent to which such potential risks and benefits are unknown.  3. Information on available alternative treatments and the risks and benefits of those alternatives, including clinical trials.  4. Patients treated with bamlanivimab and casirivimab\imdevimab should continue to self-isolate and use infection control measures (e.g., wear mask, isolate, social distance, avoid sharing personal items, clean and disinfect "high touch" surfaces, and frequent handwashing) according to CDC guidelines.   5. The patient or parent/caregiver has  the option to accept or refuse bamlanivimab or casirivimab\imdevimab .  After reviewing this information with the patient, The patient agreed to proceed with receiving the casirivimab\imdevimab infusion and will be provided a copy of the Fact sheet prior to receiving the infusion.Scot Dock 07/06/2019 1:29 PM

## 2019-07-09 ENCOUNTER — Ambulatory Visit (HOSPITAL_COMMUNITY): Payer: Commercial Managed Care - PPO

## 2019-07-16 ENCOUNTER — Telehealth: Payer: Self-pay

## 2019-07-17 ENCOUNTER — Telehealth: Payer: Self-pay

## 2019-07-17 NOTE — Telephone Encounter (Signed)
Santiago Glad called to report that Frederick Martinez's blood sugar has been greater than 300 for the last 3 days. Dr. Tobie Poet advised that he increase the Metformin 500 mg to 2 twice dao;u

## 2019-07-19 ENCOUNTER — Other Ambulatory Visit: Payer: Self-pay | Admitting: Family Medicine

## 2019-07-19 NOTE — Telephone Encounter (Signed)
Made in error

## 2019-07-23 ENCOUNTER — Other Ambulatory Visit: Payer: Self-pay

## 2019-07-23 DIAGNOSIS — E038 Other specified hypothyroidism: Secondary | ICD-10-CM

## 2019-07-23 DIAGNOSIS — E782 Mixed hyperlipidemia: Secondary | ICD-10-CM

## 2019-07-23 DIAGNOSIS — I10 Essential (primary) hypertension: Secondary | ICD-10-CM

## 2019-07-23 DIAGNOSIS — E1142 Type 2 diabetes mellitus with diabetic polyneuropathy: Secondary | ICD-10-CM

## 2019-07-27 ENCOUNTER — Other Ambulatory Visit: Payer: Commercial Managed Care - PPO

## 2019-07-27 ENCOUNTER — Other Ambulatory Visit: Payer: Self-pay

## 2019-07-27 DIAGNOSIS — E782 Mixed hyperlipidemia: Secondary | ICD-10-CM

## 2019-07-27 DIAGNOSIS — E1142 Type 2 diabetes mellitus with diabetic polyneuropathy: Secondary | ICD-10-CM

## 2019-07-27 DIAGNOSIS — E038 Other specified hypothyroidism: Secondary | ICD-10-CM

## 2019-07-27 DIAGNOSIS — I10 Essential (primary) hypertension: Secondary | ICD-10-CM

## 2019-07-27 NOTE — Progress Notes (Signed)
Patient came in for labs previously ordered.

## 2019-07-28 LAB — CBC WITH DIFFERENTIAL/PLATELET
Basophils Absolute: 0 10*3/uL (ref 0.0–0.2)
Basos: 1 %
EOS (ABSOLUTE): 0.3 10*3/uL (ref 0.0–0.4)
Eos: 5 %
Hematocrit: 37 % — ABNORMAL LOW (ref 37.5–51.0)
Hemoglobin: 13 g/dL (ref 13.0–17.7)
Immature Grans (Abs): 0 10*3/uL (ref 0.0–0.1)
Immature Granulocytes: 0 %
Lymphocytes Absolute: 1.1 10*3/uL (ref 0.7–3.1)
Lymphs: 20 %
MCH: 30.9 pg (ref 26.6–33.0)
MCHC: 35.1 g/dL (ref 31.5–35.7)
MCV: 88 fL (ref 79–97)
Monocytes Absolute: 0.5 10*3/uL (ref 0.1–0.9)
Monocytes: 8 %
Neutrophils Absolute: 3.7 10*3/uL (ref 1.4–7.0)
Neutrophils: 66 %
Platelets: 263 10*3/uL (ref 150–450)
RBC: 4.21 x10E6/uL (ref 4.14–5.80)
RDW: 12.6 % (ref 11.6–15.4)
WBC: 5.5 10*3/uL (ref 3.4–10.8)

## 2019-07-28 LAB — COMPREHENSIVE METABOLIC PANEL
ALT: 28 IU/L (ref 0–44)
AST: 26 IU/L (ref 0–40)
Albumin/Globulin Ratio: 2.1 (ref 1.2–2.2)
Albumin: 4.8 g/dL (ref 3.8–4.9)
Alkaline Phosphatase: 61 IU/L (ref 39–117)
BUN/Creatinine Ratio: 19 (ref 9–20)
BUN: 19 mg/dL (ref 6–24)
Bilirubin Total: 0.6 mg/dL (ref 0.0–1.2)
CO2: 24 mmol/L (ref 20–29)
Calcium: 10.5 mg/dL — ABNORMAL HIGH (ref 8.7–10.2)
Chloride: 102 mmol/L (ref 96–106)
Creatinine, Ser: 0.98 mg/dL (ref 0.76–1.27)
GFR calc Af Amer: 99 mL/min/{1.73_m2} (ref >59)
GFR calc non Af Amer: 86 mL/min/{1.73_m2} (ref >59)
Globulin, Total: 2.3 g/dL (ref 1.5–4.5)
Glucose: 101 mg/dL — ABNORMAL HIGH (ref 65–99)
Potassium: 4.3 mmol/L (ref 3.5–5.2)
Sodium: 142 mmol/L (ref 134–144)
Total Protein: 7.1 g/dL (ref 6.0–8.5)

## 2019-07-28 LAB — LIPID PANEL
Chol/HDL Ratio: 3.2 ratio (ref 0.0–5.0)
Cholesterol, Total: 130 mg/dL (ref 100–199)
HDL: 41 mg/dL (ref >39)
LDL Chol Calc (NIH): 51 mg/dL (ref 0–99)
Triglycerides: 243 mg/dL — ABNORMAL HIGH (ref 0–149)
VLDL Cholesterol Cal: 38 mg/dL (ref 5–40)

## 2019-07-28 LAB — TSH: TSH: 0.811 u[IU]/mL (ref 0.450–4.500)

## 2019-07-28 LAB — CARDIOVASCULAR RISK ASSESSMENT

## 2019-07-28 LAB — HEMOGLOBIN A1C
Est. average glucose Bld gHb Est-mCnc: 131 mg/dL
Hgb A1c MFr Bld: 6.2 % — ABNORMAL HIGH (ref 4.8–5.6)

## 2019-07-31 ENCOUNTER — Other Ambulatory Visit: Payer: Self-pay | Admitting: Family Medicine

## 2019-08-14 ENCOUNTER — Other Ambulatory Visit: Payer: Self-pay | Admitting: Family Medicine

## 2019-08-20 ENCOUNTER — Other Ambulatory Visit: Payer: Self-pay | Admitting: Family Medicine

## 2019-08-23 ENCOUNTER — Other Ambulatory Visit: Payer: Self-pay | Admitting: Family Medicine

## 2019-08-31 ENCOUNTER — Other Ambulatory Visit: Payer: Self-pay | Admitting: Family Medicine

## 2019-09-06 ENCOUNTER — Other Ambulatory Visit: Payer: Self-pay | Admitting: Family Medicine

## 2019-09-15 ENCOUNTER — Other Ambulatory Visit: Payer: Self-pay | Admitting: Family Medicine

## 2019-09-21 ENCOUNTER — Other Ambulatory Visit: Payer: Self-pay

## 2019-09-21 MED ORDER — ATORVASTATIN CALCIUM 10 MG PO TABS: 10.0000 mg | ORAL_TABLET | Freq: Every day | ORAL | 0 refills | Status: DC

## 2019-09-30 ENCOUNTER — Other Ambulatory Visit: Payer: Self-pay | Admitting: Family Medicine

## 2019-10-01 ENCOUNTER — Other Ambulatory Visit: Payer: Self-pay | Admitting: Physician Assistant

## 2019-10-19 ENCOUNTER — Other Ambulatory Visit: Payer: Self-pay | Admitting: Family Medicine

## 2019-11-01 ENCOUNTER — Other Ambulatory Visit: Payer: Self-pay | Admitting: Family Medicine

## 2019-11-07 ENCOUNTER — Encounter: Payer: Self-pay | Admitting: Family Medicine

## 2019-11-07 DIAGNOSIS — I10 Essential (primary) hypertension: Secondary | ICD-10-CM | POA: Insufficient documentation

## 2019-11-07 DIAGNOSIS — E1142 Type 2 diabetes mellitus with diabetic polyneuropathy: Secondary | ICD-10-CM

## 2019-11-07 DIAGNOSIS — I152 Hypertension secondary to endocrine disorders: Secondary | ICD-10-CM | POA: Insufficient documentation

## 2019-11-07 DIAGNOSIS — E038 Other specified hypothyroidism: Secondary | ICD-10-CM | POA: Insufficient documentation

## 2019-11-07 DIAGNOSIS — E782 Mixed hyperlipidemia: Secondary | ICD-10-CM | POA: Insufficient documentation

## 2019-11-07 HISTORY — DX: Type 2 diabetes mellitus with diabetic polyneuropathy: E11.42

## 2019-11-07 NOTE — Patient Instructions (Addendum)
Diabetes Mellitus and Foot Care °Foot care is an important part of your health, especially when you have diabetes. Diabetes may cause you to have problems because of poor blood flow (circulation) to your feet and legs, which can cause your skin to: °· Become thinner and drier. °· Break more easily. °· Heal more slowly. °· Peel and crack. °You may also have nerve damage (neuropathy) in your legs and feet, causing decreased feeling in them. This means that you may not notice minor injuries to your feet that could lead to more serious problems. Noticing and addressing any potential problems early is the best way to prevent future foot problems. °How to care for your feet °Foot hygiene °· Wash your feet daily with warm water and mild soap. Do not use hot water. Then, pat your feet and the areas between your toes until they are completely dry. Do not soak your feet as this can dry your skin. °· Trim your toenails straight across. Do not dig under them or around the cuticle. File the edges of your nails with an emery board or nail file. °· Apply a moisturizing lotion or petroleum jelly to the skin on your feet and to dry, brittle toenails. Use lotion that does not contain alcohol and is unscented. Do not apply lotion between your toes. °Shoes and socks °· Wear clean socks or stockings every day. Make sure they are not too tight. Do not wear knee-high stockings since they may decrease blood flow to your legs. °· Wear shoes that fit properly and have enough cushioning. Always look in your shoes before you put them on to be sure there are no objects inside. °· To break in new shoes, wear them for just a few hours a day. This prevents injuries on your feet. °Wounds, scrapes, corns, and calluses °· Check your feet daily for blisters, cuts, bruises, sores, and redness. If you cannot see the bottom of your feet, use a mirror or ask someone for help. °· Do not cut corns or calluses or try to remove them with medicine. °· If you  find a minor scrape, cut, or break in the skin on your feet, keep it and the skin around it clean and dry. You may clean these areas with mild soap and water. Do not clean the area with peroxide, alcohol, or iodine. °· If you have a wound, scrape, corn, or callus on your foot, look at it several times a day to make sure it is healing and not infected. Check for: °? Redness, swelling, or pain. °? Fluid or blood. °? Warmth. °? Pus or a bad smell. °General instructions °· Do not cross your legs. This may decrease blood flow to your feet. °· Do not use heating pads or hot water bottles on your feet. They may burn your skin. If you have lost feeling in your feet or legs, you may not know this is happening until it is too late. °· Protect your feet from hot and cold by wearing shoes, such as at the beach or on hot pavement. °· Schedule a complete foot exam at least once a year (annually) or more often if you have foot problems. If you have foot problems, report any cuts, sores, or bruises to your health care provider immediately. °Contact a health care provider if: °· You have a medical condition that increases your risk of infection and you have any cuts, sores, or bruises on your feet. °· You have an injury that is not   healing.  You have redness on your legs or feet.  You feel burning or tingling in your legs or feet.  You have pain or cramps in your legs and feet.  Your legs or feet are numb.  Your feet always feel cold.  You have pain around a toenail. Get help right away if:  You have a wound, scrape, corn, or callus on your foot and: ? You have pain, swelling, or redness that gets worse. ? You have fluid or blood coming from the wound, scrape, corn, or callus. ? Your wound, scrape, corn, or callus feels warm to the touch. ? You have pus or a bad smell coming from the wound, scrape, corn, or callus. ? You have a fever. ? You have a red line going up your leg. Summary  Check your feet every day  for cuts, sores, red spots, swelling, and blisters.  Moisturize feet and legs daily.  Wear shoes that fit properly and have enough cushioning.  If you have foot problems, report any cuts, sores, or bruises to your health care provider immediately.  Schedule a complete foot exam at least once a year (annually) or more often if you have foot problems. This information is not intended to replace advice given to you by your health care provider. Make sure you discuss any questions you have with your health care provider. Document Revised: 02/21/2019 Document Reviewed: 07/02/2016 Elsevier Patient Education  [place]. Diabetes Mellitus and Nutrition, Adult When you have diabetes (diabetes mellitus), it is very important to have healthy eating habits because your blood sugar (glucose) levels are greatly affected by what you eat and drink. Eating healthy foods in the appropriate amounts, at about the same times every day, can help you:  Control your blood glucose.  Lower your risk of heart disease.  Improve your blood pressure.  Reach or maintain a healthy weight. Every person with diabetes is different, and each person has different needs for a meal plan. Your health care provider may recommend that you work with a diet and nutrition specialist (dietitian) to make a meal plan that is best for you. Your meal plan may vary depending on factors such as:  The calories you need.  The medicines you take.  Your weight.  Your blood glucose, blood pressure, and cholesterol levels.  Your activity level.  Other health conditions you have, such as heart or kidney disease. How do carbohydrates affect me? Carbohydrates, also called carbs, affect your blood glucose level more than any other type of food. Eating carbs naturally raises the amount of glucose in your blood. Carb counting is a method for keeping track of how many carbs you eat. Counting carbs is important to keep your blood  glucose at a healthy level, especially if you use insulin or take certain oral diabetes medicines. It is important to know how many carbs you can safely have in each meal. This is different for every person. Your dietitian can help you calculate how many carbs you should have at each meal and for each snack. Foods that contain carbs include:  Bread, cereal, rice, pasta, and crackers.  Potatoes and corn.  Peas, beans, and lentils.  Milk and yogurt.  Fruit and juice.  Desserts, such as cakes, cookies, ice cream, and candy. How does alcohol affect me? Alcohol can cause a sudden decrease in blood glucose (hypoglycemia), especially if you use insulin or take certain oral diabetes medicines. Hypoglycemia can be a life-threatening condition. Symptoms of hypoglycemia (  sleepiness, dizziness, and confusion) are similar to symptoms of having too much alcohol. If your health care provider says that alcohol is safe for you, follow these guidelines:  Limit alcohol intake to no more than 1 drink per day for nonpregnant women and 2 drinks per day for men. One drink equals 12 oz of beer, 5 oz of wine, or 1 oz of hard liquor.  Do not drink on an empty stomach.  Keep yourself hydrated with water, diet soda, or unsweetened iced tea.  Keep in mind that regular soda, juice, and other mixers may contain a lot of sugar and must be counted as carbs. What are tips for following this plan?  Reading food labels  Start by checking the serving size on the "Nutrition Facts" label of packaged foods and drinks. The amount of calories, carbs, fats, and other nutrients listed on the label is based on one serving of the item. Many items contain more than one serving per package.  Check the total grams (g) of carbs in one serving. You can calculate the number of servings of carbs in one serving by dividing the total carbs by 15. For example, if a food has 30 g of total carbs, it would be equal to 2 servings of  carbs.  Check the number of grams (g) of saturated and trans fats in one serving. Choose foods that have low or no amount of these fats.  Check the number of milligrams (mg) of salt (sodium) in one serving. Most people should limit total sodium intake to less than 2,300 mg per day.  Always check the nutrition information of foods labeled as "low-fat" or "nonfat". These foods may be higher in added sugar or refined carbs and should be avoided.  Talk to your dietitian to identify your daily goals for nutrients listed on the label. Shopping  Avoid buying canned, premade, or processed foods. These foods tend to be high in fat, sodium, and added sugar.  Shop around the outside edge of the grocery store. This includes fresh fruits and vegetables, bulk grains, fresh meats, and fresh dairy. Cooking  Use low-heat cooking methods, such as baking, instead of high-heat cooking methods like deep frying.  Cook using healthy oils, such as olive, canola, or sunflower oil.  Avoid cooking with butter, cream, or high-fat meats. Meal planning  Eat meals and snacks regularly, preferably at the same times every day. Avoid going long periods of time without eating.  Eat foods high in fiber, such as fresh fruits, vegetables, beans, and whole grains. Talk to your dietitian about how many servings of carbs you can eat at each meal.  Eat 4-6 ounces (oz) of lean protein each day, such as lean meat, chicken, fish, eggs, or tofu. One oz of lean protein is equal to: ? 1 oz of meat, chicken, or fish. ? 1 egg. ?  cup of tofu.  Eat some foods each day that contain healthy fats, such as avocado, nuts, seeds, and fish. Lifestyle  Check your blood glucose regularly.  Exercise regularly as told by your health care provider. This may include: ? 150 minutes of moderate-intensity or vigorous-intensity exercise each week. This could be brisk walking, biking, or water aerobics. ? Stretching and doing strength  exercises, such as yoga or weightlifting, at least 2 times a week.  Take medicines as told by your health care provider.  Do not use any products that contain nicotine or tobacco, such as cigarettes and e-cigarettes. If you need help quitting,  ask your health care provider.  Work with a Social worker or diabetes educator to identify strategies to manage stress and any emotional and social challenges. Questions to ask a health care provider  Do I need to meet with a diabetes educator?  Do I need to meet with a dietitian?  What number can I call if I have questions?  When are the best times to check my blood glucose? Where to find more information:  American Diabetes Association: diabetes.org  Academy of Nutrition and Dietetics: www.eatright.[employer] of Diabetes and Digestive and Kidney Diseases (NIH): [employer] Summary  A healthy meal plan will help you control your blood glucose and maintain a healthy lifestyle.  Working with a diet and nutrition specialist (dietitian) can help you make a meal plan that is best for you.  Keep in mind that carbohydrates (carbs) and alcohol have immediate effects on your blood glucose levels. It is important to count carbs and to use alcohol carefully. This information is not intended to replace advice given to you by your health care provider. Make sure you discuss any questions you have with your health care provider. Document Revised: 05/13/2017 Document Reviewed: 07/05/2016 Elsevier Patient Education  [place].   High Cholesterol  High cholesterol is a condition in which the blood has high levels of a white, waxy, fat-like substance (cholesterol). The human body needs small amounts of cholesterol. The liver makes all the cholesterol that the body needs. Extra (excess) cholesterol comes from the food that we eat. Cholesterol is carried from the liver by the blood through the blood vessels. If you have high  cholesterol, deposits (plaques) may build up on the walls of your blood vessels (arteries). Plaques make the arteries narrower and stiffer. Cholesterol plaques increase your risk for heart attack and stroke. Work with your health care provider to keep your cholesterol levels in a healthy range. What increases the risk? This condition is more likely to develop in people who:  Eat foods that are high in animal fat (saturated fat) or cholesterol.  Are overweight.  Are not getting enough exercise.  Have a family history of high cholesterol. What are the signs or symptoms? There are no symptoms of this condition. How is this diagnosed? This condition may be diagnosed from the results of a blood test.  If you are older than age 22, your health care provider may check your cholesterol every 4-6 years.  You may be checked more often if you already have high cholesterol or other risk factors for heart disease. The blood test for cholesterol measures:  "Bad" cholesterol (LDL cholesterol). This is the main type of cholesterol that causes heart disease. The desired level for LDL is less than 100.  "Good" cholesterol (HDL cholesterol). This type helps to protect against heart disease by cleaning the arteries and carrying the LDL away. The desired level for HDL is 60 or higher.  Triglycerides. These are fats that the body can store or burn for energy. The desired number for triglycerides is lower than 150.  Total cholesterol. This is a measure of the total amount of cholesterol in your blood, including LDL cholesterol, HDL cholesterol, and triglycerides. A healthy number is less than 200. How is this treated? This condition is treated with diet changes, lifestyle changes, and medicines. Diet changes  This may include eating more whole grains, fruits, vegetables, nuts, and fish.  This may also include cutting back on red meat and foods that have a lot of  added sugar. Lifestyle changes  Changes  may include getting at least 40 minutes of aerobic exercise 3 times a week. Aerobic exercises include walking, biking, and swimming. Aerobic exercise along with a healthy diet can help you maintain a healthy weight.  Changes may also include quitting smoking. Medicines  Medicines are usually given if diet and lifestyle changes have failed to reduce your cholesterol to healthy levels.  Your health care provider may prescribe a statin medicine. Statin medicines have been shown to reduce cholesterol, which can reduce the risk of heart disease. Follow these instructions at home: Eating and drinking If told by your health care provider:  Eat chicken (without skin), fish, veal, shellfish, ground [place] breast, and round or loin cuts of red meat.  Do not eat fried foods or fatty meats, such as hot dogs and salami.  Eat plenty of fruits, such as apples.  Eat plenty of vegetables, such as broccoli, potatoes, and carrots.  Eat beans, peas, and lentils.  Eat grains such as barley, rice, couscous, and bulgur wheat.  Eat pasta without cream sauces.  Use skim or nonfat milk, and eat low-fat or nonfat yogurt and cheeses.  Do not eat or drink whole milk, cream, ice cream, egg yolks, or hard cheeses.  Do not eat stick margarine or tub margarines that contain trans fats (also called partially hydrogenated oils).  Do not eat saturated tropical oils, such as coconut oil and palm oil.  Do not eat cakes, cookies, crackers, or other baked goods that contain trans fats.  General instructions  Exercise as directed by your health care provider. Increase your activity level with activities such as gardening, walking, and taking the stairs.  Take over-the-counter and prescription medicines only as told by your health care provider.  Do not use any products that contain nicotine or tobacco, such as cigarettes and e-cigarettes. If you need help quitting, ask your health care provider.  Keep all  follow-up visits as told by your health care provider. This is important. Contact a health care provider if:  You are struggling to maintain a healthy diet or weight.  You need help to start on an exercise program.  You need help to stop smoking. Get help right away if:  You have chest pain.  You have trouble breathing. This information is not intended to replace advice given to you by your health care provider. Make sure you discuss any questions you have with your health care provider. Document Revised: 06/03/2017 Document Reviewed: 11/29/2015 Elsevier Patient Education  [place].

## 2019-11-07 NOTE — Progress Notes (Signed)
Subjective:  Patient ID: Melchor Drown, male    DOB: 11/20/1960  Age: 59 y.o. MRN: [id number]  Chief Complaint  Patient presents with  . Depression  . Hyperlipidemia  . Hypertension  . Hypothyroidism  . Diabetes    HPI Pt presents for follow up of hypertension.  His current cardiac medication regimen includes amlodipine, metoprolol, lisinopril, and HCTZ.  He has not kept a blood pressure diary, but states that typical readings show unknown reading.  He is tolerating the medication well without side effects.  Compliance with treatment has been poor; he does not follow a diet and exercise regimen.      Pt presents with hyperlipidemia.  Current treatment includes Lipitor, Vascepa and zetia.  Compliance with treatment has been poor; he does not follow a diet and exercise regimen.  The patient states that he is not eating healthy, but watching portion size. He is staying active at his job.    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  Compliance with treatment has been good; he takes his medication as directed and follows up as directed.  The patient states that he is not eating healthy but is staying active at his job. Primary symptoms reported include peripheral neuropathy (manifested as numbness and numbness in left arm still. Feet have improved with lyrica. ).  He specifically denies blurred vision, fatigue, polydipsia, polyphagia or polyuria.  Current meds include an oral hypoglycemic ( Glucophage ) and Prinivil for renoprotection.  He reports home blood glucose readings have averaged fasting readings in the 120-125s mg/dL range. He checks his glucose 2 times per month.  In regard to preventative care, he performs foot self-exams daily and his last ophthalmology exam was in 07/2017.    Additionally, he presents with history of other specified hypothyroidism.  he is currently  taking Levothyroid, 50 mcg daily.      Dx with major depressive disorder, recurrent, moderate; the original diagnosis of depression was made several years ago.  Presently, he feels a mild degree of depression.  Current medications include Effexor and Wellbutrin, clonazepam, trazodone and lunesta..    Past Medical History:  Diagnosis Date  . Anxiety   . Arthritis   . Cancer [facility])    kidney cancer  . Depression   . Diabetes mellitus without complication ([place]) [id number]   type II  . Essential hypertension   . GERD (gastroesophageal reflux disease)   . History of kidney stones   . Mixed hyperlipidemia    Past Surgical History:  Procedure Laterality Date  . CHOLECYSTECTOMY  2008  . PARTIAL NEPHRECTOMY  2002   kidney cancer  . SHOULDER SURGERY  2008    Family History  Problem Relation Age of Onset  . Heart disease Other   . Stroke Other   . Hypertension Other   . Diabetes Other   . CAD Other    Social History   Socioeconomic History  . Marital status: Married    Spouse name: Not on file  . Number of children: 2  . Years of education: Not on file  . Highest education level: Not on file  Occupational History  . Occupation: maintance  Tobacco Use  . Smoking status: Never Smoker  . Smokeless tobacco: Never Used  Substance and Sexual Activity  . Alcohol use: Yes    Comment: has not drank since 2011  . Drug use: No  . Sexual activity: Not on  file  Other Topics Concern  . Not on file  Social History Narrative  . Not on file   Social Determinants of Health   Financial Resource Strain:   . Difficulty of Paying Living Expenses:   Food Insecurity:   . Worried About Charity fundraiser in the Last Year:   . Arboriculturist in the Last Year:   Transportation Needs:   . Film/video editor (Medical):   Marland Kitchen Lack of Transportation (Non-Medical):   Physical Activity:   . Days of Exercise per Week:   . Minutes of Exercise per Session:   Stress:   . Feeling of Stress :     Social Connections:   . Frequency of Communication with Friends and Family:   . Frequency of Social Gatherings with Friends and Family:   . Attends Religious Services:   . Active Member of Clubs or Organizations:   . Attends Archivist Meetings:   Marland Kitchen Marital Status:     Review of Systems  Constitutional: Negative for chills, diaphoresis, fatigue and fever.  HENT: Negative for congestion, ear pain and sore throat.   Respiratory: Negative for cough and shortness of breath.   Cardiovascular: Positive for chest pain (every other day. lasts 10 minutes. unrelated to exertion.  5-6/10.  No associated symptoms. ). Negative for leg swelling.  Gastrointestinal: Negative for abdominal pain, constipation, diarrhea, nausea and vomiting.       No gerd.  Endocrine: Negative for polydipsia, polyphagia and polyuria.  Genitourinary: Negative for dysuria and urgency.  Musculoskeletal: Positive for arthralgias (Bl knee pain. MRIs done by Dr. Joie Bimler.  Knees just hurt.  No locking or giving out. ). Negative for myalgias.  Neurological: Positive for headaches (right eye to rt parietal region. Respond to aleve.). Negative for dizziness and syncope.  Psychiatric/Behavioral: Positive for dysphoric mood.       No anhedonia.      Objective:  BP 124/76   Pulse 60   Temp (!) 97.3 F (36.3 C)   Resp 16   Ht 6' (1.829 m)   Wt 257 lb (116.6 kg)   BMI 34.86 kg/m   BP/Weight 11/13/2019 08/09/2016 [id number]  Systolic BP [id number]  Diastolic BP 76 76 75  Wt. (Lbs) 257 257 242.2  BMI 34.86 34.86 32.84    Physical Exam Vitals reviewed.  Constitutional:      Appearance: Normal appearance. He is normal weight.  Neck:     Vascular: No carotid bruit.  Cardiovascular:     Rate and Rhythm: Normal rate and regular rhythm.  Pulmonary:     Effort: Pulmonary effort is normal.     Breath sounds: Normal breath sounds.  Abdominal:     General: Abdomen is flat. Bowel sounds are normal.     Palpations:  Abdomen is soft.  Musculoskeletal:        General: Tenderness (with rom. ) present. No swelling or deformity. Normal range of motion.  Neurological:     Mental Status: He is alert and oriented to person, place, and time.  Psychiatric:        Mood and Affect: Mood normal.        Behavior: Behavior normal.     Diabetic Foot Exam - Simple   Simple Foot Form Visual Inspection No deformities, no ulcerations, no other skin breakdown bilaterally: Yes Sensation Testing See comments: Yes Pulse Check Posterior Tibialis and Dorsalis pulse intact bilaterally: Yes Comments Mild decreased sensation.  Lab Results  Component Value Date   WBC 5.5 07/27/2019   HGB 13.0 07/27/2019   HCT 37.0 (L) 07/27/2019   PLT 263 07/27/2019   GLUCOSE 101 (H) 07/27/2019   CHOL 130 07/27/2019   TRIG 243 (H) 07/27/2019   HDL 41 07/27/2019   LDLCALC 51 07/27/2019   ALT 28 07/27/2019   AST 26 07/27/2019   NA 142 07/27/2019   K 4.3 07/27/2019   CL 102 07/27/2019   CREATININE 0.98 07/27/2019   BUN 19 07/27/2019   CO2 24 07/27/2019   TSH 0.811 07/27/2019   INR 1.04 03/28/2014   HGBA1C 6.2 (H) 07/27/2019      Assessment & Plan:    1. Hypertension, essential Well controlled.  No changes to medicines.  Continue to work on eating a healthy diet and exercise.  Labs drawn today.  - Comprehensive metabolic panel  2. Mixed hyperlipidemia Well controlled.  No changes to medicines.  Continue to work on eating a healthy diet and exercise.  Labs drawn today.  - Lipid panel  3. Type 2 diabetes mellitus with polyneuropathy (HCC) Control: good Recommend check sugars fasting daily. Recommend check feet daily. Recommend annual eye exams. Medicines: no changes Continue to work on eating a healthy diet and exercise.  Labs drawn today.   - CBC with Differential/Platelet - Hemoglobin A1c  4. TSH (thyroid-stimulating hormone deficiency) Well controlled.  No changes to medicines.  Continue to  work on eating a healthy diet and exercise.  Labs drawn today.  - TSH   5. Chronic pain of left knee  6. Chronic pain of right knee   Meds ordered this encounter  Medications  . Eszopiclone 3 MG TABS    Sig: Take 1 tablet (3 mg total) by mouth at bedtime. Take immediately before bedtime    Dispense:  90 tablet    Refill:  1    Orders Placed This Encounter  Procedures  . Lipid panel  . Comprehensive metabolic panel  . CBC with Differential/Platelet  . TSH  . Hemoglobin A1c  . POCT UA - Microalbumin     Follow-up: Return in 3 months (on 02/13/2020).  An After Visit Summary was printed and given to the patient.  Rochel Brome [facility] [phone]

## 2019-11-09 ENCOUNTER — Ambulatory Visit: Payer: Commercial Managed Care - PPO | Admitting: Family Medicine

## 2019-11-13 ENCOUNTER — Encounter: Payer: Self-pay | Admitting: Family Medicine

## 2019-11-13 ENCOUNTER — Ambulatory Visit: Payer: Commercial Managed Care - PPO | Admitting: Family Medicine

## 2019-11-13 ENCOUNTER — Other Ambulatory Visit: Payer: Self-pay

## 2019-11-13 VITALS — BP 124/76 | HR 60 | Temp 97.3°F | Resp 16 | Ht 72.0 in | Wt 257.0 lb

## 2019-11-13 DIAGNOSIS — M25561 Pain in right knee: Secondary | ICD-10-CM

## 2019-11-13 DIAGNOSIS — E782 Mixed hyperlipidemia: Secondary | ICD-10-CM

## 2019-11-13 DIAGNOSIS — G8929 Other chronic pain: Secondary | ICD-10-CM

## 2019-11-13 DIAGNOSIS — E1142 Type 2 diabetes mellitus with diabetic polyneuropathy: Secondary | ICD-10-CM | POA: Diagnosis not present

## 2019-11-13 DIAGNOSIS — I10 Essential (primary) hypertension: Secondary | ICD-10-CM | POA: Diagnosis not present

## 2019-11-13 DIAGNOSIS — E038 Other specified hypothyroidism: Secondary | ICD-10-CM | POA: Diagnosis not present

## 2019-11-13 DIAGNOSIS — M25562 Pain in left knee: Secondary | ICD-10-CM

## 2019-11-13 LAB — POCT UA - MICROALBUMIN: Microalbumin Ur, POC: 150 mg/L

## 2019-11-13 MED ORDER — ESZOPICLONE 3 MG PO TABS: 3.0000 mg | ORAL_TABLET | Freq: Every day | ORAL | 1 refills | Status: DC

## 2019-11-14 LAB — CBC WITH DIFFERENTIAL/PLATELET
Basophils Absolute: 0.1 10*3/uL (ref 0.0–0.2)
Basos: 1 %
EOS (ABSOLUTE): 0.3 10*3/uL (ref 0.0–0.4)
Eos: 5 %
Hematocrit: 37.5 % (ref 37.5–51.0)
Hemoglobin: 13.2 g/dL (ref 13.0–17.7)
Immature Grans (Abs): 0 10*3/uL (ref 0.0–0.1)
Immature Granulocytes: 0 %
Lymphocytes Absolute: 1.3 10*3/uL (ref 0.7–3.1)
Lymphs: 22 %
MCH: 31.2 pg (ref 26.6–33.0)
MCHC: 35.2 g/dL (ref 31.5–35.7)
MCV: 89 fL (ref 79–97)
Monocytes Absolute: 0.4 10*3/uL (ref 0.1–0.9)
Monocytes: 7 %
Neutrophils Absolute: 3.8 10*3/uL (ref 1.4–7.0)
Neutrophils: 65 %
Platelets: 240 10*3/uL (ref 150–450)
RBC: 4.23 x10E6/uL (ref 4.14–5.80)
RDW: 12.6 % (ref 11.6–15.4)
WBC: 5.8 10*3/uL (ref 3.4–10.8)

## 2019-11-14 LAB — COMPREHENSIVE METABOLIC PANEL
ALT: 28 IU/L (ref 0–44)
AST: 30 IU/L (ref 0–40)
Albumin/Globulin Ratio: 2.1 (ref 1.2–2.2)
Albumin: 4.7 g/dL (ref 3.8–4.9)
Alkaline Phosphatase: 62 IU/L (ref 48–121)
BUN/Creatinine Ratio: 16 (ref 9–20)
BUN: 16 mg/dL (ref 6–24)
Bilirubin Total: 0.3 mg/dL (ref 0.0–1.2)
CO2: 25 mmol/L (ref 20–29)
Calcium: 10 mg/dL (ref 8.7–10.2)
Chloride: 101 mmol/L (ref 96–106)
Creatinine, Ser: 1.03 mg/dL (ref 0.76–1.27)
GFR calc Af Amer: 93 mL/min/{1.73_m2} (ref >59)
GFR calc non Af Amer: 80 mL/min/{1.73_m2} (ref >59)
Globulin, Total: 2.2 g/dL (ref 1.5–4.5)
Glucose: 105 mg/dL — ABNORMAL HIGH (ref 65–99)
Potassium: 4.1 mmol/L (ref 3.5–5.2)
Sodium: 141 mmol/L (ref 134–144)
Total Protein: 6.9 g/dL (ref 6.0–8.5)

## 2019-11-14 LAB — HEMOGLOBIN A1C
Est. average glucose Bld gHb Est-mCnc: 123 mg/dL
Hgb A1c MFr Bld: 5.9 % — ABNORMAL HIGH (ref 4.8–5.6)

## 2019-11-14 LAB — LIPID PANEL
Chol/HDL Ratio: 3.8 ratio (ref 0.0–5.0)
Cholesterol, Total: 151 mg/dL (ref 100–199)
HDL: 40 mg/dL (ref >39)
LDL Chol Calc (NIH): 71 mg/dL (ref 0–99)
Triglycerides: 244 mg/dL — ABNORMAL HIGH (ref 0–149)
VLDL Cholesterol Cal: 40 mg/dL (ref 5–40)

## 2019-11-14 LAB — TSH: TSH: 1.83 u[IU]/mL (ref 0.450–4.500)

## 2019-11-14 LAB — CARDIOVASCULAR RISK ASSESSMENT

## 2019-11-19 ENCOUNTER — Other Ambulatory Visit: Payer: Self-pay | Admitting: Family Medicine

## 2019-11-25 ENCOUNTER — Encounter: Payer: Self-pay | Admitting: Family Medicine

## 2019-11-25 DIAGNOSIS — G8929 Other chronic pain: Secondary | ICD-10-CM | POA: Insufficient documentation

## 2019-11-25 DIAGNOSIS — M25562 Pain in left knee: Secondary | ICD-10-CM | POA: Insufficient documentation

## 2019-11-25 HISTORY — DX: Other chronic pain: G89.29

## 2019-11-29 ENCOUNTER — Other Ambulatory Visit: Payer: Self-pay

## 2019-11-29 ENCOUNTER — Other Ambulatory Visit: Payer: Self-pay | Admitting: Family Medicine

## 2019-11-29 NOTE — Addendum Note (Signed)
Addended by: Thompson Caul I on: 11/29/2019 04:20 PM   Modules accepted: Orders

## 2019-12-01 MED ORDER — CLONAZEPAM 0.5 MG PO TABS: 0.5000 mg | ORAL_TABLET | Freq: Two times a day (BID) | ORAL | 2 refills | Status: DC

## 2019-12-31 ENCOUNTER — Other Ambulatory Visit: Payer: Self-pay | Admitting: Physician Assistant

## 2020-01-01 ENCOUNTER — Other Ambulatory Visit: Payer: Self-pay

## 2020-01-01 ENCOUNTER — Other Ambulatory Visit: Payer: Self-pay | Admitting: Family Medicine

## 2020-01-01 MED ORDER — ATORVASTATIN CALCIUM 10 MG PO TABS: 10.0000 mg | ORAL_TABLET | Freq: Every day | ORAL | 0 refills | Status: DC

## 2020-01-01 NOTE — Telephone Encounter (Signed)
Patient only needs a 10 day supply sent to [facility] due to mail in pharmacy not getting it out to the patient in time. LA

## 2020-01-15 ENCOUNTER — Other Ambulatory Visit: Payer: Self-pay | Admitting: Family Medicine

## 2020-01-28 ENCOUNTER — Other Ambulatory Visit: Payer: Self-pay

## 2020-01-28 ENCOUNTER — Encounter: Payer: Self-pay | Admitting: Family Medicine

## 2020-01-28 ENCOUNTER — Ambulatory Visit: Payer: Commercial Managed Care - PPO | Admitting: Family Medicine

## 2020-01-28 VITALS — BP 98/64 | HR 68 | Temp 97.4°F | Resp 18 | Ht 72.0 in | Wt 262.8 lb

## 2020-01-28 DIAGNOSIS — K219 Gastro-esophageal reflux disease without esophagitis: Secondary | ICD-10-CM

## 2020-01-28 DIAGNOSIS — M5416 Radiculopathy, lumbar region: Secondary | ICD-10-CM | POA: Diagnosis not present

## 2020-01-28 DIAGNOSIS — R1013 Epigastric pain: Secondary | ICD-10-CM

## 2020-01-28 HISTORY — DX: Gastro-esophageal reflux disease without esophagitis: K21.9

## 2020-01-28 MED ORDER — METHOCARBAMOL 750 MG PO TABS: 750.0000 mg | ORAL_TABLET | Freq: Four times a day (QID) | ORAL | 0 refills | Status: DC

## 2020-01-28 MED ORDER — FAMOTIDINE 40 MG PO TABS: 40.0000 mg | ORAL_TABLET | Freq: Two times a day (BID) | ORAL | 2 refills | Status: DC

## 2020-01-28 NOTE — Patient Instructions (Signed)
Stop meloxicam and naproxen.  May take tylenol for back.

## 2020-01-28 NOTE — Progress Notes (Signed)
Acute Office Visit  Subjective:    Patient ID: Frederick Martinez, male    DOB: 1960-11-27, 59 y.o.   MRN: [id number]  Chief Complaint  Patient presents with  . Back Pain    HPI Patient presented today for progressively worsening left lower back pain however while he was here he had sudden onset of severe epigastric pain 10 out of 10.  He denies nausea or vomiting.  Denies chest pain.  Bowels have been fine.  He does admit that he has been taking approximately 5 Aleve a day on top of his meloxicam that he is taking once a day.  Patient was given a Pepcid in the office as well as a GI cocktail however did not respond significantly..Currently on dexilant.   His lumbar back pain which has been going on for 2 weeks radiates down into bilateral lower extremities.  He also complains of knee pain.  Patient is taking methocarbamol 750 3 times daily.  He is also been taking meloxicam once a day as well as Aleve 5 times a day.  Past Medical History:  Diagnosis Date  . Anxiety   . Arthritis   . Cancer [facility])    kidney cancer  . Depression   . Diabetes mellitus without complication ([name]) 4742   type II  . Essential hypertension   . GERD (gastroesophageal reflux disease)   . History of kidney stones   . Mixed hyperlipidemia     Past Surgical History:  Procedure Laterality Date  . CHOLECYSTECTOMY  2008  . PARTIAL NEPHRECTOMY  2002   kidney cancer  . SHOULDER SURGERY  2008    Family History  Problem Relation Age of Onset  . Heart disease Other   . Stroke Other   . Hypertension Other   . Diabetes Other   . CAD Other     Social History   Socioeconomic History  . Marital status: Married    Spouse name: Not on file  . Number of children: 2  . Years of education: Not on file  . Highest education level: Not on file  Occupational History  . Occupation: maintance  Tobacco Use  . Smoking status: Never Smoker  . Smokeless tobacco: Never Used  Substance and Sexual Activity  . Alcohol  use: Yes    Comment: has not drank since 2011  . Drug use: No  . Sexual activity: Not on file  Other Topics Concern  . Not on file  Social History Narrative  . Not on file   Social Determinants of Health   Financial Resource Strain:   . Difficulty of Paying Living Expenses:   Food Insecurity:   . Worried About Charity fundraiser in the Last Year:   . Arboriculturist in the Last Year:   Transportation Needs:   . Film/video editor (Medical):   Marland Kitchen Lack of Transportation (Non-Medical):   Physical Activity:   . Days of Exercise per Week:   . Minutes of Exercise per Session:   Stress:   . Feeling of Stress :   Social Connections:   . Frequency of Communication with Friends and Family:   . Frequency of Social Gatherings with Friends and Family:   . Attends Religious Services:   . Active Member of Clubs or Organizations:   . Attends Archivist Meetings:   Marland Kitchen Marital Status:   Intimate Partner Violence:   . Fear of Current or Ex-Partner:   . Emotionally Abused:   .  Physically Abused:   . Sexually Abused:     Outpatient Medications Prior to Visit  Medication Sig Dispense Refill  . amitriptyline (ELAVIL) 10 MG tablet Take 10 mg by mouth at bedtime.    Marland Kitchen amLODipine (NORVASC) 10 MG tablet TAKE 1 TABLET DAILY 90 tablet 3  . atorvastatin (LIPITOR) 10 MG tablet Take 1 tablet (10 mg total) by mouth daily. 10 tablet 0  . buPROPion (WELLBUTRIN XL) 150 MG 24 hr tablet Take 150 mg by mouth every morning.    . clonazePAM (KLONOPIN) 0.5 MG tablet Take 1 tablet (0.5 mg total) by mouth 2 (two) times daily. 60 tablet 2  . dexlansoprazole (DEXILANT) 60 MG capsule Take 60 mg by mouth daily.    . Eszopiclone 3 MG TABS Take 1 tablet (3 mg total) by mouth at bedtime. Take immediately before bedtime 90 tablet 1  . ezetimibe (ZETIA) 10 MG tablet Take 10 mg by mouth daily.    . furosemide (LASIX) 20 MG tablet TAKE 1 TABLET DAILY 90 tablet 3  . hydrochlorothiazide (HYDRODIURIL) 25 MG  tablet Take 12.5 mg by mouth daily.     Marland Kitchen HYDROcodone-acetaminophen (VICODIN) 5-500 MG per tablet Take 1 tablet by mouth every 6 (six) hours as needed.      Marland Kitchen levothyroxine (SYNTHROID) 50 MCG tablet TAKE 1 TABLET DAILY 90 tablet 3  . LINZESS 290 MCG CAPS capsule TAKE 1 CAPSULE DAILY 30 MINUTES BEFORE THE FIRST MEAL OF THE DAY 90 capsule 3  . lisinopril (ZESTRIL) 10 MG tablet TAKE 1 TABLET DAILY FOR KIDNEY PROTECTION 90 tablet 3  . meloxicam (MOBIC) 15 MG tablet TAKE 1 TABLET DAILY 90 tablet 3  . metFORMIN (GLUCOPHAGE) 500 MG tablet TAKE 1 TABLET TWICE A DAY 180 tablet 3  . metoprolol (TOPROL-XL) 50 MG 24 hr tablet Take 50 mg by mouth daily.     . metoprolol succinate (TOPROL-XL) 25 MG 24 hr tablet TAKE 1 TABLET DAILY 90 tablet 3  . omega-3 acid ethyl esters (LOVAZA) 1 G capsule Take 2 g by mouth 2 (two) times daily.     . Pitavastatin Calcium (LIVALO) 4 MG TABS Take by mouth.    . pregabalin (LYRICA) 150 MG capsule Take 150 mg by mouth 2 (two) times daily.    . promethazine (PHENERGAN) 25 MG tablet TAKE 1 TABLET BY MOUTH EVERY 6 HOURS AS NEEDED FOR NAUSEA/ VOMITING 60 tablet 1  . ramelteon (ROZEREM) 8 MG tablet Take 8 mg by mouth at bedtime.    . tamsulosin (FLOMAX) 0.4 MG CAPS capsule Take 0.4 mg by mouth.    . traZODone (DESYREL) 50 MG tablet Take 50 mg by mouth at bedtime.     . valACYclovir (VALTREX) 1000 MG tablet TAKE 1 TABLET BY MOUTH TWICE DAILY FOR 7 DAYS AS NEEDED FOR COLD SORES. 14 tablet 2  . VASCEPA 1 g capsule TAKE 2 CAPSULES TWICE A DAY WITH FOOD SWALLOWING WHOLE, DO NOT CHEW, OPEN, DISSOLVE AND/OR CRUSH 360 capsule 3  . venlafaxine (EFFEXOR-XR) 150 MG 24 hr capsule Take 150 mg by mouth daily.     Marland Kitchen venlafaxine XR (EFFEXOR-XR) 75 MG 24 hr capsule TAKE 3 CAPSULES DAILY 270 capsule 3  . methocarbamol (ROBAXIN) 750 MG tablet TAKE 1 TABLET BY MOUTH EVERY 8 HOURS AS NEEDED FOR MUSCLE SPASMS 270 tablet 0  . nabumetone (RELAFEN) 750 MG tablet Take 750 mg by mouth 2 (two) times daily.      Marland Kitchen oxymorphone (OPANA ER) 15 MG 12 hr tablet  Take 15 mg by mouth 3 (three) times daily.    . predniSONE (DELTASONE) 20 MG tablet 3 Tabs PO Days 1-3, then 2 tabs PO Days 4-6, then 1 tab PO Day 7-9, then Half Tab PO Day 10-12 20 tablet 0  . traMADol (ULTRAM) 50 MG tablet Take 1 tablet (50 mg total) by mouth every 6 (six) hours as needed. 15 tablet 0   No facility-administered medications prior to visit.    No Known Allergies  Review of Systems  Constitutional: Positive for fatigue. Negative for chills and fever.  HENT: Negative for congestion.   Respiratory: Negative for cough and shortness of breath.   Cardiovascular: Negative for chest pain and palpitations.  Gastrointestinal: Positive for abdominal pain. Negative for constipation, diarrhea, nausea and vomiting.  Genitourinary: Negative for dysuria and frequency.  Musculoskeletal: Positive for arthralgias (bilateral knee pain ) and back pain (left-sided back pain ).  Psychiatric/Behavioral: Negative for dysphoric mood. The patient is not nervous/anxious.        Objective:    Physical Exam Vitals reviewed.  Constitutional:      General: He is in acute distress.     Appearance: He is obese.  Cardiovascular:     Rate and Rhythm: Normal rate and regular rhythm.     Heart sounds: Normal heart sounds.  Pulmonary:     Effort: Pulmonary effort is normal.     Breath sounds: Normal breath sounds.  Abdominal:     General: There is distension.     Tenderness: There is abdominal tenderness (Epigastric tenderness severe.  Patient also has moderate diffuse tenderness.  Decreased bowel sounds.  Abdomen is taut.  Patient is guarding with questionable rebound.  No CVA tenderness.  He does have left lumbar tenderness with some spasm which is lower.).  Neurological:     Mental Status: He is alert.    BP 120/78   Pulse 68   Temp (!) 97.4 F (36.3 C)   Resp 18   Ht 6' (1.829 m)   Wt 262 lb 12.8 oz (119.2 kg)   SpO2 94%   BMI 35.64 kg/m   Wt Readings from Last 3 Encounters:  01/28/20 262 lb 12.8 oz (119.2 kg)  11/13/19 257 lb (116.6 kg)  08/09/16 257 lb (116.6 kg)    Health Maintenance Due  Topic Date Due  . Hepatitis C Screening  Never done  . PNEUMOCOCCAL POLYSACCHARIDE VACCINE AGE 6-64 HIGH RISK  Never done  . FOOT EXAM  Never done  . OPHTHALMOLOGY EXAM  Never done  . COVID-19 Vaccine (1) Never done  . HIV Screening  Never done  . TETANUS/TDAP  Never done  . COLONOSCOPY  Never done  . INFLUENZA VACCINE  01/13/2020    There are no preventive care reminders to display for this patient.   Lab Results  Component Value Date   TSH 1.830 11/13/2019   Lab Results  Component Value Date   WBC 5.8 11/13/2019   HGB 13.2 11/13/2019   HCT 37.5 11/13/2019   MCV 89 11/13/2019   PLT 240 11/13/2019   Lab Results  Component Value Date   NA 141 11/13/2019   K 4.1 11/13/2019   CO2 25 11/13/2019   GLUCOSE 105 (H) 11/13/2019   BUN 16 11/13/2019   CREATININE 1.03 11/13/2019   BILITOT 0.3 11/13/2019   ALKPHOS 62 11/13/2019   AST 30 11/13/2019   ALT 28 11/13/2019   PROT 6.9 11/13/2019   ALBUMIN 4.7 11/13/2019   CALCIUM 10.0  11/13/2019   Lab Results  Component Value Date   CHOL 151 11/13/2019   Lab Results  Component Value Date   HDL 40 11/13/2019   Lab Results  Component Value Date   LDLCALC 71 11/13/2019   Lab Results  Component Value Date   TRIG 244 (H) 11/13/2019   Lab Results  Component Value Date   CHOLHDL 3.8 11/13/2019   Lab Results  Component Value Date   HGBA1C 5.9 (H) 11/13/2019       Assessment & Plan:  1. Epigastric abdominal pain No response to gi cocktail. Gave pepcid.  EKG: NSR. No ST changes. Admit to ED. EMS called.  Rx sent - famotidine (PEPCID) 40 MG tablet; Take 1 tablet (40 mg total) by mouth 2 (two) times daily.  Dispense: 60 tablet; Refill: 2 We discussed discontinuation of meloxicam and aleve. Pt should not have been taking both.   2. Acute lumbar radiculopathy   Work up after abdominal pain addressed.  I did send an increased dose of methocarbamol 750 mg one four times a day.    An After Visit Summary was printed and given to the patient.  Rochel Brome [facility] [phone]

## 2020-01-29 DIAGNOSIS — R079 Chest pain, unspecified: Secondary | ICD-10-CM

## 2020-01-29 DIAGNOSIS — I517 Cardiomegaly: Secondary | ICD-10-CM

## 2020-01-31 ENCOUNTER — Other Ambulatory Visit: Payer: Self-pay | Admitting: Family Medicine

## 2020-02-04 ENCOUNTER — Encounter: Payer: Self-pay | Admitting: Family Medicine

## 2020-02-04 ENCOUNTER — Ambulatory Visit: Payer: Commercial Managed Care - PPO | Admitting: Family Medicine

## 2020-02-04 ENCOUNTER — Other Ambulatory Visit: Payer: Self-pay

## 2020-02-04 VITALS — BP 124/70 | HR 60 | Temp 99.2°F | Resp 18 | Ht 72.0 in | Wt 255.6 lb

## 2020-02-04 DIAGNOSIS — E782 Mixed hyperlipidemia: Secondary | ICD-10-CM

## 2020-02-04 DIAGNOSIS — E1142 Type 2 diabetes mellitus with diabetic polyneuropathy: Secondary | ICD-10-CM

## 2020-02-04 DIAGNOSIS — I249 Acute ischemic heart disease, unspecified: Secondary | ICD-10-CM

## 2020-02-04 DIAGNOSIS — R7401 Elevation of levels of liver transaminase levels: Secondary | ICD-10-CM

## 2020-02-04 DIAGNOSIS — Z6834 Body mass index (BMI) 34.0-34.9, adult: Secondary | ICD-10-CM

## 2020-02-04 DIAGNOSIS — I10 Essential (primary) hypertension: Secondary | ICD-10-CM

## 2020-02-04 HISTORY — DX: Acute ischemic heart disease, unspecified: I24.9

## 2020-02-04 MED ORDER — HYDROCODONE-ACETAMINOPHEN 5-325 MG PO TABS
1.0000 | ORAL_TABLET | Freq: Four times a day (QID) | ORAL | 0 refills | Status: DC | PRN
Start: 2020-02-04 — End: 2020-02-20

## 2020-02-04 NOTE — Progress Notes (Signed)
Established Patient Office Visit  Subjective:  Patient ID: Frederick Martinez, male    DOB: [birth year]  Age: 59 y.o. MRN: [id number]  CC:  Chief Complaint  Patient presents with  . Hospitalization Follow-up    HPI Frederick Martinez presents for hospital follow-up.  Patient was admitted on January 28, 2020 and discharged on January 29, 2020.  The patient was in the office for routine follow-up and began having severe epigastric pain unlike she never had before.  It radiated substernally.  EKG in the office was normal.  I did give him a GI cocktail which gave him minimal relief.  EMS was called and he was transported to [facility] where he was placed in the waiting room.  After approximately 4 hours the epigastric pain/chest pain improved.  Last stress test was May 2016 which was low risk.  Ntg given x 3 and another medicine. NTG eventually helped. He was admitted for acute coronary syndrome.  Troponins remain negative and EKG showed an no ischemic changes.  A Lexiscan stress test was obtained that showed low risk study although he did have an EF of 47%.  Repeat regular echo was obtained and showed a normal ejection fraction.  Patient's cholesterol was checked while he was in the hospital and it was recommended increase his Lipitor to 40 mg daily.  He is here today for follow-up.  I had been concerned the increase in his nsaid use had been the cause. Epigastric pain has improved.   Chronic back pain: Taking methocarbamol 750 mg one four times a day.    Past Medical History:  Diagnosis Date  . Anxiety   . Arthritis   . Cancer [facility])    kidney cancer  . Depression   . Diabetes mellitus without complication ([place]) 6073   type II  . Essential hypertension   . GERD (gastroesophageal reflux disease)   . History of kidney stones   . Mixed hyperlipidemia     Past Surgical History:  Procedure Laterality Date  . CHOLECYSTECTOMY  2008  . PARTIAL NEPHRECTOMY  2002   kidney cancer  . SHOULDER  SURGERY  2008    Family History  Problem Relation Age of Onset  . Heart disease Other   . Stroke Other   . Hypertension Other   . Diabetes Other   . CAD Other     Social History   Socioeconomic History  . Marital status: Married    Spouse name: Not on file  . Number of children: 2  . Years of education: Not on file  . Highest education level: Not on file  Occupational History  . Occupation: maintance  Tobacco Use  . Smoking status: Never Smoker  . Smokeless tobacco: Never Used  Substance and Sexual Activity  . Alcohol use: Yes    Comment: has not drank since 2011  . Drug use: No  . Sexual activity: Not on file  Other Topics Concern  . Not on file  Social History Narrative  . Not on file   Social Determinants of Health   Financial Resource Strain:   . Difficulty of Paying Living Expenses: Not on file  Food Insecurity:   . Worried About Charity fundraiser in the Last Year: Not on file  . Ran Out of Food in the Last Year: Not on file  Transportation Needs:   . Lack of Transportation (Medical): Not on file  . Lack of Transportation (Non-Medical): Not on file  Physical Activity:   .  Days of Exercise per Week: Not on file  . Minutes of Exercise per Session: Not on file  Stress:   . Feeling of Stress : Not on file  Social Connections:   . Frequency of Communication with Friends and Family: Not on file  . Frequency of Social Gatherings with Friends and Family: Not on file  . Attends Religious Services: Not on file  . Active Member of Clubs or Organizations: Not on file  . Attends Archivist Meetings: Not on file  . Marital Status: Not on file  Intimate Partner Violence:   . Fear of Current or Ex-Partner: Not on file  . Emotionally Abused: Not on file  . Physically Abused: Not on file  . Sexually Abused: Not on file    Outpatient Medications Prior to Visit  Medication Sig Dispense Refill  . amitriptyline (ELAVIL) 25 MG tablet Take 25 mg by mouth at  bedtime.    Marland Kitchen amLODipine (NORVASC) 10 MG tablet TAKE 1 TABLET DAILY 90 tablet 3  . buPROPion (WELLBUTRIN XL) 150 MG 24 hr tablet Take 150 mg by mouth every morning.    . clonazePAM (KLONOPIN) 0.5 MG tablet Take 1 tablet (0.5 mg total) by mouth 2 (two) times daily. 60 tablet 2  . dexlansoprazole (DEXILANT) 60 MG capsule Take 60 mg by mouth daily.    . Eszopiclone 3 MG TABS Take 1 tablet (3 mg total) by mouth at bedtime. Take immediately before bedtime 90 tablet 1  . ezetimibe (ZETIA) 10 MG tablet Take 10 mg by mouth daily.    . famotidine (PEPCID) 40 MG tablet Take 1 tablet (40 mg total) by mouth 2 (two) times daily. 60 tablet 2  . furosemide (LASIX) 20 MG tablet TAKE 1 TABLET DAILY 90 tablet 3  . hydrochlorothiazide (HYDRODIURIL) 25 MG tablet Take 12.5 mg by mouth daily.     Marland Kitchen levothyroxine (SYNTHROID) 50 MCG tablet TAKE 1 TABLET DAILY 90 tablet 3  . LINZESS 290 MCG CAPS capsule TAKE 1 CAPSULE DAILY 30 MINUTES BEFORE THE FIRST MEAL OF THE DAY 90 capsule 3  . lisinopril (ZESTRIL) 10 MG tablet TAKE 1 TABLET DAILY FOR KIDNEY PROTECTION 90 tablet 3  . metFORMIN (GLUCOPHAGE) 500 MG tablet TAKE 1 TABLET TWICE A DAY 180 tablet 3  . methocarbamol (ROBAXIN) 750 MG tablet Take 1 tablet (750 mg total) by mouth 4 (four) times daily. 120 tablet 0  . metoprolol succinate (TOPROL-XL) 25 MG 24 hr tablet TAKE 1 TABLET DAILY 90 tablet 3  . omega-3 acid ethyl esters (LOVAZA) 1 G capsule Take 2 g by mouth 2 (two) times daily.     . Pitavastatin Calcium (LIVALO) 4 MG TABS Take by mouth.    . pregabalin (LYRICA) 150 MG capsule Take 150 mg by mouth 2 (two) times daily.    . pregabalin (LYRICA) 300 MG capsule TAKE 1 CAPSULE BY MOUTH TWICE A DAY 180 capsule 1  . promethazine (PHENERGAN) 25 MG tablet TAKE 1 TABLET BY MOUTH EVERY 6 HOURS AS NEEDED FOR NAUSEA/ VOMITING 60 tablet 1  . ramelteon (ROZEREM) 8 MG tablet Take 8 mg by mouth at bedtime.    . tamsulosin (FLOMAX) 0.4 MG CAPS capsule Take 0.4 mg by mouth.    .  traZODone (DESYREL) 50 MG tablet Take 50 mg by mouth at bedtime.     . valACYclovir (VALTREX) 1000 MG tablet TAKE 1 TABLET BY MOUTH TWICE DAILY FOR 7 DAYS AS NEEDED FOR COLD SORES. 14 tablet 2  .  VASCEPA 1 g capsule TAKE 2 CAPSULES TWICE A DAY WITH FOOD SWALLOWING WHOLE, DO NOT CHEW, OPEN, DISSOLVE AND/OR CRUSH 360 capsule 3  . venlafaxine (EFFEXOR-XR) 150 MG 24 hr capsule Take 150 mg by mouth daily.     Marland Kitchen venlafaxine XR (EFFEXOR-XR) 75 MG 24 hr capsule TAKE 3 CAPSULES DAILY 270 capsule 3  . atorvastatin (LIPITOR) 10 MG tablet Take 1 tablet (10 mg total) by mouth daily. (Patient taking differently: Take 20 mg by mouth daily. ) 10 tablet 0  . meloxicam (MOBIC) 15 MG tablet TAKE 1 TABLET DAILY 90 tablet 3  . atorvastatin (LIPITOR) 20 MG tablet Take 20 mg by mouth daily.     No facility-administered medications prior to visit.    No Known Allergies  ROS Review of Systems  Constitutional: Positive for fatigue. Negative for chills and fever.  HENT: Negative for congestion, ear pain and sore throat.   Respiratory: Negative for cough and shortness of breath.   Cardiovascular: Negative for chest pain and palpitations.  Gastrointestinal: Positive for abdominal pain. Negative for constipation, diarrhea, nausea and vomiting.  Endocrine: Negative for polydipsia, polyphagia and polyuria.  Genitourinary: Negative for dysuria.  Musculoskeletal: Positive for back pain.  Neurological: Positive for dizziness and headaches (chronic. ).  Psychiatric/Behavioral: Negative for dysphoric mood. The patient is not nervous/anxious.       Objective:    Physical Exam Vitals reviewed.  Constitutional:      Appearance: Normal appearance.  Cardiovascular:     Rate and Rhythm: Normal rate and regular rhythm.     Heart sounds: Normal heart sounds.  Pulmonary:     Effort: No respiratory distress.     Breath sounds: Normal breath sounds.  Abdominal:     General: Abdomen is flat. Bowel sounds are normal.      Tenderness: There is abdominal tenderness (mild epigastric).  Neurological:     Mental Status: He is alert and oriented to person, place, and time.  Psychiatric:        Mood and Affect: Mood normal.        Behavior: Behavior normal.     BP 124/70   Pulse 60   Temp 99.2 F (37.3 C)   Resp 18   Ht 6' (1.829 m)   Wt 255 lb 9.6 oz (115.9 kg)   BMI 34.67 kg/m  Wt Readings from Last 3 Encounters:  02/04/20 255 lb 9.6 oz (115.9 kg)  01/28/20 262 lb 12.8 oz (119.2 kg)  11/13/19 257 lb (116.6 kg)     Health Maintenance Due  Topic Date Due  . Hepatitis C Screening  Never done  . FOOT EXAM  Never done  . OPHTHALMOLOGY EXAM  Never done  . COVID-19 Vaccine (1) Never done  . HIV Screening  Never done  . TETANUS/TDAP  Never done  . COLONOSCOPY  Never done  . INFLUENZA VACCINE  01/13/2020    There are no preventive care reminders to display for this patient.  Lab Results  Component Value Date   TSH 1.830 11/13/2019   Lab Results  Component Value Date   WBC 5.8 11/13/2019   HGB 13.2 11/13/2019   HCT 37.5 11/13/2019   MCV 89 11/13/2019   PLT 240 11/13/2019   Lab Results  Component Value Date   NA 141 11/13/2019   K 4.1 11/13/2019   CO2 25 11/13/2019   GLUCOSE 105 (H) 11/13/2019   BUN 16 11/13/2019   CREATININE 1.03 11/13/2019   BILITOT 0.3 11/13/2019  ALKPHOS 62 11/13/2019   AST 30 11/13/2019   ALT 28 11/13/2019   PROT 6.9 11/13/2019   ALBUMIN 4.7 11/13/2019   CALCIUM 10.0 11/13/2019   Lab Results  Component Value Date   CHOL 151 11/13/2019   Lab Results  Component Value Date   HDL 40 11/13/2019   Lab Results  Component Value Date   LDLCALC 71 11/13/2019   Lab Results  Component Value Date   TRIG 244 (H) 11/13/2019   Lab Results  Component Value Date   CHOLHDL 3.8 11/13/2019   Lab Results  Component Value Date   HGBA1C 5.9 (H) 11/13/2019      Assessment & Plan:  1. Type 2 diabetes mellitus with polyneuropathy (HCC) Control: good Recommend  check sugars fasting daily. Recommend check feet daily. Recommend annual eye exams. Medicines: no changes Continue to work on eating a healthy diet and exercise.  Labs drawn today.    2. Mixed hyperlipidemia Well controlled.  No changes to medicines.  Continue to work on eating a healthy diet and exercise.   3. Hypertension, essential Well controlled.  No changes to medicines.  Continue to work on eating a healthy diet and exercise.   4. Acute coronary syndrome (HCC) No evidence of heart disease.  5. BMI 34.0-34.9,adult Recommend continue to work on eating healthy diet and exercise.  6. Elevated transaminases. Check CMP.  Follow-up: Return in about 3 months (around 05/06/2020) for FASTING.    Rochel Brome, MD

## 2020-02-05 LAB — COMPREHENSIVE METABOLIC PANEL WITH GFR
ALT: 51 IU/L — ABNORMAL HIGH (ref 0–44)
AST: 27 IU/L (ref 0–40)
Albumin/Globulin Ratio: 2.4 — ABNORMAL HIGH (ref 1.2–2.2)
Albumin: 5 g/dL — ABNORMAL HIGH (ref 3.8–4.9)
Alkaline Phosphatase: 70 IU/L (ref 48–121)
BUN/Creatinine Ratio: 16 (ref 9–20)
BUN: 19 mg/dL (ref 6–24)
Bilirubin Total: 0.4 mg/dL (ref 0.0–1.2)
CO2: 24 mmol/L (ref 20–29)
Calcium: 10 mg/dL (ref 8.7–10.2)
Chloride: 101 mmol/L (ref 96–106)
Creatinine, Ser: 1.16 mg/dL (ref 0.76–1.27)
GFR calc Af Amer: 80 mL/min/1.73 (ref >59)
GFR calc non Af Amer: 70 mL/min/1.73 (ref >59)
Globulin, Total: 2.1 g/dL (ref 1.5–4.5)
Glucose: 97 mg/dL (ref 65–99)
Potassium: 4.5 mmol/L (ref 3.5–5.2)
Sodium: 138 mmol/L (ref 134–144)
Total Protein: 7.1 g/dL (ref 6.0–8.5)

## 2020-02-05 LAB — CBC WITH DIFFERENTIAL/PLATELET
Basophils Absolute: 0.1 10*3/uL (ref 0.0–0.2)
Basos: 1 %
EOS (ABSOLUTE): 0.3 10*3/uL (ref 0.0–0.4)
Eos: 5 %
Hematocrit: 37.9 % (ref 37.5–51.0)
Hemoglobin: 12.9 g/dL — ABNORMAL LOW (ref 13.0–17.7)
Immature Grans (Abs): 0 10*3/uL (ref 0.0–0.1)
Immature Granulocytes: 0 %
Lymphocytes Absolute: 1.6 10*3/uL (ref 0.7–3.1)
Lymphs: 28 %
MCH: 31.3 pg (ref 26.6–33.0)
MCHC: 34 g/dL (ref 31.5–35.7)
MCV: 92 fL (ref 79–97)
Monocytes Absolute: 0.6 10*3/uL (ref 0.1–0.9)
Monocytes: 10 %
Neutrophils Absolute: 3.3 10*3/uL (ref 1.4–7.0)
Neutrophils: 56 %
Platelets: 242 10*3/uL (ref 150–450)
RBC: 4.12 x10E6/uL — ABNORMAL LOW (ref 4.14–5.80)
RDW: 13 % (ref 11.6–15.4)
WBC: 5.9 10*3/uL (ref 3.4–10.8)

## 2020-02-07 ENCOUNTER — Other Ambulatory Visit: Payer: Self-pay | Admitting: Family Medicine

## 2020-02-11 ENCOUNTER — Ambulatory Visit: Payer: Commercial Managed Care - PPO | Admitting: Family Medicine

## 2020-02-13 NOTE — Progress Notes (Signed)
Cancelled. kc 

## 2020-02-15 ENCOUNTER — Ambulatory Visit: Payer: Commercial Managed Care - PPO | Admitting: Family Medicine

## 2020-02-20 ENCOUNTER — Other Ambulatory Visit: Payer: Self-pay

## 2020-02-20 ENCOUNTER — Ambulatory Visit: Payer: Commercial Managed Care - PPO | Admitting: Family Medicine

## 2020-02-20 ENCOUNTER — Encounter: Payer: Self-pay | Admitting: Family Medicine

## 2020-02-20 VITALS — BP 130/68 | HR 80 | Temp 97.4°F | Resp 18 | Ht 72.0 in | Wt 262.0 lb

## 2020-02-20 DIAGNOSIS — M5442 Lumbago with sciatica, left side: Secondary | ICD-10-CM | POA: Diagnosis not present

## 2020-02-20 MED ORDER — PREDNISONE 50 MG PO TABS: 50.0000 mg | ORAL_TABLET | Freq: Every day | ORAL | 0 refills | Status: DC

## 2020-02-20 MED ORDER — HYDROCODONE-ACETAMINOPHEN 5-325 MG PO TABS
1.0000 | ORAL_TABLET | Freq: Four times a day (QID) | ORAL | 0 refills | Status: DC | PRN
Start: 2020-02-20 — End: 2020-03-13

## 2020-02-20 MED ORDER — PREGABALIN 300 MG PO CAPS: 300.0000 mg | ORAL_CAPSULE | Freq: Two times a day (BID) | ORAL | 0 refills | Status: DC

## 2020-02-20 NOTE — Progress Notes (Signed)
Acute Office Visit  Subjective:    Patient ID: Frederick Martinez, male    DOB: January 31, 1961, 59 y.o.   MRN: [id number]  Chief Complaint  Patient presents with  . Back Pain    HPI Patient is in today for mid back pain with numbness in rt leg. Pains shoot down leg. Has been taking hydrocodone once daily which has not helped. Taking robaxin four times a day. Unable to nsaids due to ulcers. Has history of chronic back pain and has had an abnormal MRI few years ago.   Past Medical History:  Diagnosis Date  . Anxiety   . Arthritis   . Cancer [facility])    kidney cancer  . Depression   . Diabetes mellitus without complication ([place]) 4174   type II  . Essential hypertension   . GERD (gastroesophageal reflux disease)   . History of kidney stones   . Mixed hyperlipidemia     Past Surgical History:  Procedure Laterality Date  . CHOLECYSTECTOMY  2008  . PARTIAL NEPHRECTOMY  2002   kidney cancer  . SHOULDER SURGERY  2008    Family History  Problem Relation Age of Onset  . Heart disease Other   . Stroke Other   . Hypertension Other   . Diabetes Other   . CAD Other     Social History   Socioeconomic History  . Marital status: Married    Spouse name: Not on file  . Number of children: 2  . Years of education: Not on file  . Highest education level: Not on file  Occupational History  . Occupation: maintance  Tobacco Use  . Smoking status: Never Smoker  . Smokeless tobacco: Never Used  Substance and Sexual Activity  . Alcohol use: Yes    Comment: has not drank since 2011  . Drug use: No  . Sexual activity: Not on file  Other Topics Concern  . Not on file  Social History Narrative  . Not on file   Social Determinants of Health   Financial Resource Strain:   . Difficulty of Paying Living Expenses: Not on file  Food Insecurity:   . Worried About Charity fundraiser in the Last Year: Not on file  . Ran Out of Food in the Last Year: Not on file  Transportation Needs:   .  Lack of Transportation (Medical): Not on file  . Lack of Transportation (Non-Medical): Not on file  Physical Activity:   . Days of Exercise per Week: Not on file  . Minutes of Exercise per Session: Not on file  Stress:   . Feeling of Stress : Not on file  Social Connections:   . Frequency of Communication with Friends and Family: Not on file  . Frequency of Social Gatherings with Friends and Family: Not on file  . Attends Religious Services: Not on file  . Active Member of Clubs or Organizations: Not on file  . Attends Archivist Meetings: Not on file  . Marital Status: Not on file  Intimate Partner Violence:   . Fear of Current or Ex-Partner: Not on file  . Emotionally Abused: Not on file  . Physically Abused: Not on file  . Sexually Abused: Not on file    Outpatient Medications Prior to Visit  Medication Sig Dispense Refill  . amitriptyline (ELAVIL) 25 MG tablet TAKE 1 TABLET AT BEDTIME 90 tablet 3  . amLODipine (NORVASC) 10 MG tablet TAKE 1 TABLET DAILY 90 tablet 3  .  atorvastatin (LIPITOR) 20 MG tablet Take 20 mg by mouth daily.    Marland Kitchen buPROPion (WELLBUTRIN XL) 150 MG 24 hr tablet TAKE 1 TABLET DAILY 90 tablet 3  . clonazePAM (KLONOPIN) 0.5 MG tablet Take 1 tablet (0.5 mg total) by mouth 2 (two) times daily. 60 tablet 2  . dexlansoprazole (DEXILANT) 60 MG capsule Take 60 mg by mouth daily.    . Eszopiclone 3 MG TABS Take 1 tablet (3 mg total) by mouth at bedtime. Take immediately before bedtime 90 tablet 1  . ezetimibe (ZETIA) 10 MG tablet Take 10 mg by mouth daily.    . famotidine (PEPCID) 40 MG tablet Take 1 tablet (40 mg total) by mouth 2 (two) times daily. 60 tablet 2  . furosemide (LASIX) 20 MG tablet TAKE 1 TABLET DAILY 90 tablet 3  . hydrochlorothiazide (HYDRODIURIL) 25 MG tablet Take 12.5 mg by mouth daily.     Marland Kitchen levothyroxine (SYNTHROID) 50 MCG tablet TAKE 1 TABLET DAILY 90 tablet 3  . LINZESS 290 MCG CAPS capsule TAKE 1 CAPSULE DAILY 30 MINUTES BEFORE THE  FIRST MEAL OF THE DAY 90 capsule 3  . lisinopril (ZESTRIL) 10 MG tablet TAKE 1 TABLET DAILY FOR KIDNEY PROTECTION 90 tablet 3  . metFORMIN (GLUCOPHAGE) 500 MG tablet TAKE 1 TABLET TWICE A DAY 180 tablet 3  . methocarbamol (ROBAXIN) 750 MG tablet Take 1 tablet (750 mg total) by mouth 4 (four) times daily. 120 tablet 0  . metoprolol succinate (TOPROL-XL) 25 MG 24 hr tablet TAKE 1 TABLET DAILY 90 tablet 3  . omega-3 acid ethyl esters (LOVAZA) 1 G capsule Take 2 g by mouth 2 (two) times daily.     . Pitavastatin Calcium (LIVALO) 4 MG TABS Take by mouth.    . promethazine (PHENERGAN) 25 MG tablet TAKE 1 TABLET BY MOUTH EVERY 6 HOURS AS NEEDED FOR NAUSEA/ VOMITING 60 tablet 1  . ramelteon (ROZEREM) 8 MG tablet Take 8 mg by mouth at bedtime.    . tamsulosin (FLOMAX) 0.4 MG CAPS capsule Take 0.4 mg by mouth.    . traZODone (DESYREL) 50 MG tablet Take 50 mg by mouth at bedtime.     . valACYclovir (VALTREX) 1000 MG tablet TAKE 1 TABLET BY MOUTH TWICE DAILY FOR 7 DAYS AS NEEDED FOR COLD SORES. 14 tablet 2  . VASCEPA 1 g capsule TAKE 2 CAPSULES TWICE A DAY WITH FOOD SWALLOWING WHOLE, DO NOT CHEW, OPEN, DISSOLVE AND/OR CRUSH 360 capsule 3  . venlafaxine (EFFEXOR-XR) 150 MG 24 hr capsule Take 150 mg by mouth daily.     Marland Kitchen venlafaxine XR (EFFEXOR-XR) 75 MG 24 hr capsule TAKE 3 CAPSULES DAILY 270 capsule 3  . HYDROcodone-acetaminophen (NORCO/VICODIN) 5-325 MG tablet Take 1 tablet by mouth every 6 (six) hours as needed for moderate pain. 30 tablet 0  . pregabalin (LYRICA) 150 MG capsule Take 150 mg by mouth 2 (two) times daily.    . pregabalin (LYRICA) 300 MG capsule TAKE 1 CAPSULE BY MOUTH TWICE A DAY 180 capsule 1   No facility-administered medications prior to visit.    No Known Allergies  Review of Systems  Constitutional: Negative for chills, fatigue and fever.  HENT: Negative for congestion, ear pain and sore throat.   Respiratory: Negative for cough and shortness of breath.   Cardiovascular:  Negative for chest pain.  Gastrointestinal: Negative for abdominal pain, constipation, diarrhea, nausea and vomiting.  Endocrine: Negative for polydipsia, polyphagia and polyuria.  Genitourinary: Negative for dysuria, frequency and hematuria.  Musculoskeletal: Positive for back pain. Negative for arthralgias and myalgias.  Neurological: Negative for dizziness and headaches.  Psychiatric/Behavioral: Negative for dysphoric mood.       No dysphoria       Objective:    Physical Exam Vitals reviewed.  Constitutional:      Appearance: Normal appearance.  Neck:     Vascular: No carotid bruit.  Cardiovascular:     Rate and Rhythm: Normal rate and regular rhythm.     Pulses: Normal pulses.     Heart sounds: Normal heart sounds.  Pulmonary:     Effort: Pulmonary effort is normal.     Breath sounds: Normal breath sounds. No wheezing, rhonchi or rales.  Abdominal:     General: Bowel sounds are normal.     Palpations: Abdomen is soft.     Tenderness: There is no abdominal tenderness.  Musculoskeletal:     Comments: Non tender over vertebrae. Upper lumbar muscles-tightened and spasm Reflexes unable to assess  Neurological:     Mental Status: He is alert.  Psychiatric:        Mood and Affect: Mood normal.        Behavior: Behavior normal.     BP 130/68   Pulse 80   Temp (!) 97.4 F (36.3 C)   Resp 18   Ht 6' (1.829 m)   Wt 262 lb (118.8 kg)   BMI 35.53 kg/m  Wt Readings from Last 3 Encounters:  02/20/20 262 lb (118.8 kg)  02/04/20 255 lb 9.6 oz (115.9 kg)  01/28/20 262 lb 12.8 oz (119.2 kg)    Health Maintenance Due  Topic Date Due  . Hepatitis C Screening  Never done  . FOOT EXAM  Never done  . OPHTHALMOLOGY EXAM  Never done  . COVID-19 Vaccine (1) Never done  . HIV Screening  Never done  . TETANUS/TDAP  Never done  . COLONOSCOPY  Never done  . INFLUENZA VACCINE  01/13/2020    There are no preventive care reminders to display for this patient.   Lab Results    Component Value Date   TSH 1.830 11/13/2019   Lab Results  Component Value Date   WBC 5.9 02/04/2020   HGB 12.9 (L) 02/04/2020   HCT 37.9 02/04/2020   MCV 92 02/04/2020   PLT 242 02/04/2020   Lab Results  Component Value Date   NA 138 02/04/2020   K 4.5 02/04/2020   CO2 24 02/04/2020   GLUCOSE 97 02/04/2020   BUN 19 02/04/2020   CREATININE 1.16 02/04/2020   BILITOT 0.4 02/04/2020   ALKPHOS 70 02/04/2020   AST 27 02/04/2020   ALT 51 (H) 02/04/2020   PROT 7.1 02/04/2020   ALBUMIN 5.0 (H) 02/04/2020   CALCIUM 10.0 02/04/2020   Lab Results  Component Value Date   CHOL 151 11/13/2019   Lab Results  Component Value Date   HDL 40 11/13/2019   Lab Results  Component Value Date   LDLCALC 71 11/13/2019   Lab Results  Component Value Date   TRIG 244 (H) 11/13/2019   Lab Results  Component Value Date   CHOLHDL 3.8 11/13/2019   Lab Results  Component Value Date   HGBA1C 5.9 (H) 11/13/2019       Assessment & Plan:  1. Acute left-sided low back pain with left-sided sciatica  Start prednisone 50 mg once daily x 5 days.  Take robaxin 750 mg one four times a day. Increase hydrocodone to four times a  day as need.  Do not work for 5 days.  If not improved by next week, call and will consider further xrays/MRIs.    Meds ordered this encounter  Medications  . HYDROcodone-acetaminophen (NORCO/VICODIN) 5-325 MG tablet    Sig: Take 1 tablet by mouth every 6 (six) hours as needed for moderate pain.    Dispense:  40 tablet    Refill:  0  . predniSONE (DELTASONE) 50 MG tablet    Sig: Take 1 tablet (50 mg total) by mouth daily with breakfast.    Dispense:  5 tablet    Refill:  0  . pregabalin (LYRICA) 300 MG capsule    Sig: Take 1 capsule (300 mg total) by mouth 2 (two) times daily.    Dispense:  60 capsule    Refill:  0    No orders of the defined types were placed in this encounter.    Follow-up: No follow-ups on file.  An After Visit Summary was printed and  given to the patient.  Rochel Brome [facility] [phone]

## 2020-02-20 NOTE — Patient Instructions (Signed)
Start prednisone 50 mg once daily x 5 days.  Take robaxin 750 mg one four times a day. Increase hydrocodone to four times a day as need.  Do not work for 5 days.  If not improved by next week, call and will consider further xrays/MRIs.  Back Exercises These exercises help to make your trunk and back strong. They also help to keep the lower back flexible. Doing these exercises can help to prevent back pain or lessen existing pain.  If you have back pain, try to do these exercises 2-3 times each day or as told by your doctor.  As you get better, do the exercises once each day. Repeat the exercises more often as told by your doctor.  To stop back pain from coming back, do the exercises once each day, or as told by your doctor. Exercises Single knee to chest Do these steps 3-5 times in a row for each leg: 1. Lie on your back on a firm bed or the floor with your legs stretched out. 2. Bring one knee to your chest. 3. Grab your knee or thigh with both hands and hold them it in place. 4. Pull on your knee until you feel a gentle stretch in your lower back or buttocks. 5. Keep doing the stretch for 10-30 seconds. 6. Slowly let go of your leg and straighten it. Pelvic tilt Do these steps 5-10 times in a row: 1. Lie on your back on a firm bed or the floor with your legs stretched out. 2. Bend your knees so they point up to the ceiling. Your feet should be flat on the floor. 3. Tighten your lower belly (abdomen) muscles to press your lower back against the floor. This will make your tailbone point up to the ceiling instead of pointing down to your feet or the floor. 4. Stay in this position for 5-10 seconds while you gently tighten your muscles and breathe evenly. Cat-cow Do these steps until your lower back bends more easily: 1. Get on your hands and knees on a firm surface. Keep your hands under your shoulders, and keep your knees under your hips. You may put padding under your knees. 2. Let  your head hang down toward your chest. Tighten (contract) the muscles in your belly. Point your tailbone toward the floor so your lower back becomes rounded like the back of a cat. 3. Stay in this position for 5 seconds. 4. Slowly lift your head. Let the muscles of your belly relax. Point your tailbone up toward the ceiling so your back forms a sagging arch like the back of a cow. 5. Stay in this position for 5 seconds.  Press-ups Do these steps 5-10 times in a row: 1. Lie on your belly (face-down) on the floor. 2. Place your hands near your head, about shoulder-width apart. 3. While you keep your back relaxed and keep your hips on the floor, slowly straighten your arms to raise the top half of your body and lift your shoulders. Do not use your back muscles. You may change where you place your hands in order to make yourself more comfortable. 4. Stay in this position for 5 seconds. 5. Slowly return to lying flat on the floor.  Bridges Do these steps 10 times in a row: 1. Lie on your back on a firm surface. 2. Bend your knees so they point up to the ceiling. Your feet should be flat on the floor. Your arms should be flat at your  sides, next to your body. 3. Tighten your butt muscles and lift your butt off the floor until your waist is almost as high as your knees. If you do not feel the muscles working in your butt and the back of your thighs, slide your feet 1-2 inches farther away from your butt. 4. Stay in this position for 3-5 seconds. 5. Slowly lower your butt to the floor, and let your butt muscles relax. If this exercise is too easy, try doing it with your arms crossed over your chest. Belly crunches Do these steps 5-10 times in a row: 1. Lie on your back on a firm bed or the floor with your legs stretched out. 2. Bend your knees so they point up to the ceiling. Your feet should be flat on the floor. 3. Cross your arms over your chest. 4. Tip your chin a little bit toward your chest  but do not bend your neck. 5. Tighten your belly muscles and slowly raise your chest just enough to lift your shoulder blades a tiny bit off of the floor. Avoid raising your body higher than that, because it can put too much stress on your low back. 6. Slowly lower your chest and your head to the floor. Back lifts Do these steps 5-10 times in a row: 1. Lie on your belly (face-down) with your arms at your sides, and rest your forehead on the floor. 2. Tighten the muscles in your legs and your butt. 3. Slowly lift your chest off of the floor while you keep your hips on the floor. Keep the back of your head in line with the curve in your back. Look at the floor while you do this. 4. Stay in this position for 3-5 seconds. 5. Slowly lower your chest and your face to the floor. Contact a doctor if:  Your back pain gets a lot worse when you do an exercise.  Your back pain does not get better 2 hours after you exercise. If you have any of these problems, stop doing the exercises. Do not do them again unless your doctor says it is okay. Get help right away if:  You have sudden, very bad back pain. If this happens, stop doing the exercises. Do not do them again unless your doctor says it is okay. This information is not intended to replace advice given to you by your health care provider. Make sure you discuss any questions you have with your health care provider. Document Revised: 02/23/2018 Document Reviewed: 02/23/2018 Elsevier Patient Education  2020 [employer].

## 2020-03-01 ENCOUNTER — Other Ambulatory Visit: Payer: Self-pay | Admitting: Family Medicine

## 2020-03-11 ENCOUNTER — Other Ambulatory Visit: Payer: Self-pay | Admitting: Family Medicine

## 2020-03-13 ENCOUNTER — Other Ambulatory Visit: Payer: Self-pay | Admitting: Family Medicine

## 2020-03-13 ENCOUNTER — Other Ambulatory Visit: Payer: Self-pay

## 2020-03-13 DIAGNOSIS — M5442 Lumbago with sciatica, left side: Secondary | ICD-10-CM

## 2020-03-13 MED ORDER — HYDROCODONE-ACETAMINOPHEN 5-325 MG PO TABS
1.0000 | ORAL_TABLET | Freq: Four times a day (QID) | ORAL | 0 refills | Status: DC | PRN
Start: 2020-03-13 — End: 2020-04-14

## 2020-03-28 ENCOUNTER — Other Ambulatory Visit: Payer: Self-pay | Admitting: Family Medicine

## 2020-04-07 ENCOUNTER — Other Ambulatory Visit: Payer: Self-pay | Admitting: Family Medicine

## 2020-04-07 ENCOUNTER — Other Ambulatory Visit: Payer: Self-pay

## 2020-04-07 DIAGNOSIS — M5442 Lumbago with sciatica, left side: Secondary | ICD-10-CM

## 2020-04-07 NOTE — Progress Notes (Signed)
re

## 2020-04-14 ENCOUNTER — Other Ambulatory Visit: Payer: Self-pay

## 2020-04-14 MED ORDER — HYDROCODONE-ACETAMINOPHEN 5-325 MG PO TABS
1.0000 | ORAL_TABLET | Freq: Four times a day (QID) | ORAL | 0 refills | Status: DC | PRN
Start: 2020-04-14 — End: 2020-05-12

## 2020-04-28 ENCOUNTER — Other Ambulatory Visit: Payer: Self-pay | Admitting: Family Medicine

## 2020-04-28 DIAGNOSIS — R1013 Epigastric pain: Secondary | ICD-10-CM

## 2020-05-01 ENCOUNTER — Other Ambulatory Visit: Payer: Self-pay

## 2020-05-12 ENCOUNTER — Other Ambulatory Visit: Payer: Self-pay

## 2020-05-12 ENCOUNTER — Other Ambulatory Visit: Payer: Self-pay | Admitting: Family Medicine

## 2020-05-12 MED ORDER — HYDROCODONE-ACETAMINOPHEN 5-325 MG PO TABS
1.0000 | ORAL_TABLET | Freq: Four times a day (QID) | ORAL | 0 refills | Status: DC | PRN
Start: 2020-05-12 — End: 2020-07-07

## 2020-05-12 NOTE — Telephone Encounter (Signed)
Taking 1 pill a day and would like a refill. States his back is still hurting

## 2020-05-13 ENCOUNTER — Other Ambulatory Visit: Payer: Self-pay | Admitting: Family Medicine

## 2020-05-16 ENCOUNTER — Encounter: Payer: Self-pay | Admitting: Family Medicine

## 2020-05-16 ENCOUNTER — Ambulatory Visit: Payer: Commercial Managed Care - PPO | Admitting: Family Medicine

## 2020-05-16 ENCOUNTER — Other Ambulatory Visit: Payer: Self-pay

## 2020-05-16 VITALS — BP 114/70 | HR 80 | Temp 97.0°F | Resp 16 | Ht 72.0 in | Wt 261.0 lb

## 2020-05-16 DIAGNOSIS — I10 Essential (primary) hypertension: Secondary | ICD-10-CM

## 2020-05-16 DIAGNOSIS — E782 Mixed hyperlipidemia: Secondary | ICD-10-CM

## 2020-05-16 DIAGNOSIS — Z23 Encounter for immunization: Secondary | ICD-10-CM

## 2020-05-16 DIAGNOSIS — E1142 Type 2 diabetes mellitus with diabetic polyneuropathy: Secondary | ICD-10-CM | POA: Diagnosis not present

## 2020-05-16 DIAGNOSIS — M5412 Radiculopathy, cervical region: Secondary | ICD-10-CM | POA: Diagnosis not present

## 2020-05-16 DIAGNOSIS — M5416 Radiculopathy, lumbar region: Secondary | ICD-10-CM

## 2020-05-16 DIAGNOSIS — E039 Hypothyroidism, unspecified: Secondary | ICD-10-CM

## 2020-05-16 LAB — POCT UA - MICROALBUMIN: Microalbumin Ur, POC: 150 mg/L

## 2020-05-16 MED ORDER — LISINOPRIL 20 MG PO TABS: 20.0000 mg | ORAL_TABLET | Freq: Every day | ORAL | 1 refills | Status: DC

## 2020-05-16 MED ORDER — PREDNISONE 50 MG PO TABS: 50.0000 mg | ORAL_TABLET | Freq: Every day | ORAL | 0 refills | Status: DC

## 2020-05-16 NOTE — Progress Notes (Signed)
Subjective:  Patient ID: Frederick Martinez, male    DOB: Sep 29, 1960  Age: 59 y.o. MRN: [id number]  Chief Complaint  Patient presents with  . Diabetes    HPI  Hypertension-Well controlled. Norvasc, HCTZ, Lasix and Lisinopril  Hyperlipidemia- Lipitor, Zetia d/c vascepa  GERD-Dexilant  Hypothyroidism-Synthroid  Diabetes- Controlled with metformin, checks sugar 2 times a month ranges from 100-120, checks feet daily. Overdue for eye exam.  Anxiety and Depression- Controlled well with Wellbutrin and Klonipin  Back pain-Taking methocarbamol and norco. Currently going to PT.  CIC- Linzess helps  Insomnia-Eszopiclone and Amitriptyline.  Is well controlled  Current Outpatient Medications on File Prior to Visit  Medication Sig Dispense Refill  . amitriptyline (ELAVIL) 25 MG tablet TAKE 1 TABLET AT BEDTIME 90 tablet 3  . amLODipine (NORVASC) 10 MG tablet TAKE 1 TABLET DAILY (Patient taking differently: Take 10 mg by mouth. Take one every other day) 90 tablet 3  . Ascorbic Acid (VITAMIN C) 1000 MG tablet Take 1,000 mg by mouth daily.    Marland Kitchen atorvastatin (LIPITOR) 10 MG tablet Take 10 mg by mouth daily.    Marland Kitchen buPROPion (WELLBUTRIN XL) 150 MG 24 hr tablet TAKE 1 TABLET DAILY 90 tablet 3  . clonazePAM (KLONOPIN) 0.5 MG tablet TAKE 1 TABLET BY MOUTH 2 TIMES DAILY. 60 tablet 1  . DEXILANT 60 MG capsule TAKE 1 CAPSULE DAILY 90 capsule 3  . Eszopiclone 3 MG TABS Take 1 tablet (3 mg total) by mouth at bedtime. Take immediately before bedtime 90 tablet 1  . ezetimibe (ZETIA) 10 MG tablet TAKE 1 TABLET DAILY 90 tablet 3  . famotidine (PEPCID) 40 MG tablet TAKE 1 TABLET BY MOUTH 2 TIMES DAILY. 60 tablet 1  . furosemide (LASIX) 20 MG tablet TAKE 1 TABLET DAILY 90 tablet 3  . HYDROcodone-acetaminophen (NORCO/VICODIN) 5-325 MG tablet Take 1 tablet by mouth every 6 (six) hours as needed for moderate pain. 40 tablet 0  . levocetirizine (XYZAL) 5 MG tablet Take by mouth every evening.    Marland Kitchen levothyroxine  (SYNTHROID) 50 MCG tablet TAKE 1 TABLET DAILY 90 tablet 3  . LINZESS 290 MCG CAPS capsule TAKE 1 CAPSULE DAILY 30 MINUTES BEFORE THE FIRST MEAL OF THE DAY 90 capsule 3  . meloxicam (MOBIC) 15 MG tablet Take 15 mg by mouth daily.    . metFORMIN (GLUCOPHAGE) 500 MG tablet TAKE 1 TABLET TWICE A DAY 180 tablet 3  . methocarbamol (ROBAXIN) 750 MG tablet TAKE 1 TABLET BY MOUTH 4 TIMES DAILY. 120 tablet 2  . metoprolol succinate (TOPROL-XL) 25 MG 24 hr tablet TAKE 1 TABLET DAILY 90 tablet 3  . Multiple Vitamins-Minerals (CENTRUM SILVER 50+MEN PO) Take by mouth.    . pregabalin (LYRICA) 300 MG capsule Take 1 capsule (300 mg total) by mouth 2 (two) times daily. 60 capsule 0  . traZODone (DESYREL) 50 MG tablet TAKE 1 TO 2 TABLETS AT BEDTIME 180 tablet 1  . valACYclovir (VALTREX) 1000 MG tablet TAKE 1 TABLET BY MOUTH TWICE DAILY FOR 7 DAYS AS NEEDED FOR COLD SORES. 14 tablet 2  . VASCEPA 1 g capsule TAKE 2 CAPSULES TWICE A DAY WITH FOOD SWALLOWING WHOLE, DO NOT CHEW, OPEN, DISSOLVE AND/OR CRUSH 360 capsule 3  . venlafaxine XR (EFFEXOR-XR) 75 MG 24 hr capsule TAKE 3 CAPSULES DAILY (Patient taking differently: Take 150 mg by mouth. Take 2 every am.) 270 capsule 3  . vitamin B-12 (CYANOCOBALAMIN) 250 MCG tablet Take 250 mcg by mouth daily.    Marland Kitchen  promethazine (PHENERGAN) 25 MG tablet TAKE 1 TABLET BY MOUTH EVERY 6 HOURS AS NEEDED FOR NAUSEA/ VOMITING 60 tablet 0   No current facility-administered medications on file prior to visit.   Past Medical History:  Diagnosis Date  . Anxiety   . Arthritis   . Cancer [facility])    kidney cancer  . Depression   . Diabetes mellitus without complication ([place]) 0093   type II  . Essential hypertension   . GERD (gastroesophageal reflux disease)   . History of kidney stones   . Mixed hyperlipidemia    Past Surgical History:  Procedure Laterality Date  . CHOLECYSTECTOMY  2008  . PARTIAL NEPHRECTOMY  2002   kidney cancer  . SHOULDER SURGERY  2008    Family History   Problem Relation Age of Onset  . Heart disease Other   . Stroke Other   . Hypertension Other   . Diabetes Other   . CAD Other    Social History   Socioeconomic History  . Marital status: Married    Spouse name: Not on file  . Number of children: 2  . Years of education: Not on file  . Highest education level: Not on file  Occupational History  . Occupation: maintance  Tobacco Use  . Smoking status: Never Smoker  . Smokeless tobacco: Never Used  Substance and Sexual Activity  . Alcohol use: Yes    Comment: has not drank since 2011  . Drug use: No  . Sexual activity: Not on file  Other Topics Concern  . Not on file  Social History Narrative  . Not on file   Social Determinants of Health   Financial Resource Strain:   . Difficulty of Paying Living Expenses: Not on file  Food Insecurity:   . Worried About Charity fundraiser in the Last Year: Not on file  . Ran Out of Food in the Last Year: Not on file  Transportation Needs:   . Lack of Transportation (Medical): Not on file  . Lack of Transportation (Non-Medical): Not on file  Physical Activity:   . Days of Exercise per Week: Not on file  . Minutes of Exercise per Session: Not on file  Stress:   . Feeling of Stress : Not on file  Social Connections:   . Frequency of Communication with Friends and Family: Not on file  . Frequency of Social Gatherings with Friends and Family: Not on file  . Attends Religious Services: Not on file  . Active Member of Clubs or Organizations: Not on file  . Attends Archivist Meetings: Not on file  . Marital Status: Not on file    Review of Systems  Constitutional: Negative for chills and fever.  HENT: Positive for congestion. Negative for rhinorrhea and sore throat.   Respiratory: Negative for cough and shortness of breath.   Cardiovascular: Negative for chest pain and palpitations.  Gastrointestinal: Positive for nausea. Negative for abdominal pain, constipation,  diarrhea and vomiting.  Genitourinary: Negative for dysuria and urgency.  Musculoskeletal: Positive for arthralgias, back pain (Complaining of neck pain.  Numbness down his right arm intermittently.  Burns.) and myalgias.  Neurological: Positive for headaches. Negative for dizziness.  Psychiatric/Behavioral: Negative for dysphoric mood. The patient is not nervous/anxious.      Objective:  BP 114/70   Pulse 80   Temp (!) 97 F (36.1 C)   Resp 16   Ht 6' (1.829 m)   Wt 261 lb (118.4 kg)  BMI 35.40 kg/m   BP/Weight 05/16/2020 02/20/2020 0/86/7619  Systolic BP 509 326 712  Diastolic BP 70 68 70  Wt. (Lbs) 261 262 255.6  BMI 35.4 35.53 34.67   Physical Exam Vitals reviewed.  Constitutional:      Appearance: Normal appearance. He is obese.  Neck:     Vascular: No carotid bruit.  Cardiovascular:     Rate and Rhythm: Normal rate and regular rhythm.     Pulses: Normal pulses.     Heart sounds: Normal heart sounds.  Pulmonary:     Effort: Pulmonary effort is normal. No respiratory distress.     Breath sounds: Normal breath sounds.  Abdominal:     General: Bowel sounds are normal.     Palpations: Abdomen is soft.     Tenderness: There is no abdominal tenderness.  Musculoskeletal:        General: Tenderness (Lumbar) present.  Skin:    Findings: No lesion.  Neurological:     Mental Status: He is alert and oriented to person, place, and time.  Psychiatric:        Mood and Affect: Mood normal.        Behavior: Behavior normal.       Diabetic Foot Exam - Simple   Simple Foot Form Diabetic Foot exam was performed with the following findings: Yes 05/16/2020  9:00 AM  Visual Inspection No deformities, no ulcerations, no other skin breakdown bilaterally: Yes Sensation Testing See comments: Yes Pulse Check Posterior Tibialis and Dorsalis pulse intact bilaterally: Yes Comments Decreased sensation bilaterally.      Lab Results  Component Value Date   WBC 4.9 05/16/2020    HGB 12.9 (L) 05/16/2020   HCT 36.9 (L) 05/16/2020   PLT 248 05/16/2020   GLUCOSE 114 (H) 05/16/2020   CHOL 148 05/16/2020   TRIG 271 (H) 05/16/2020   HDL 38 (L) 05/16/2020   LDLCALC 67 05/16/2020   ALT 38 05/16/2020   AST 33 05/16/2020   NA 139 05/16/2020   K 4.3 05/16/2020   CL 101 05/16/2020   CREATININE 1.07 05/16/2020   BUN 13 05/16/2020   CO2 22 05/16/2020   TSH 1.420 05/16/2020   INR 1.04 03/28/2014   HGBA1C 6.0 (H) 05/16/2020   MICROALBUR 150 05/16/2020      Assessment & Plan:   1. Hypertension, essential Well-controlled.  Continue current medications these were reviewed with the patient. - CBC with Differential/Platelet - Comprehensive metabolic panel  2. Type 2 diabetes mellitus with polyneuropathy (HCC) Continue to check sugars 2-3 times per week. Recommend eat healthy and exercise.  Avoid sugars. Check feet daily. Annual eye exam. - Hemoglobin A1c - POCT UA - Microalbumin 150.  Maximum medical treatment has been given.  3. Mixed hyperlipidemia Uncontrolled.  Start on fenofibrate once daily. Recommend low-fat diet and exercise. - Lipid panel  4. Cervical radiculopathy - DG Cervical Spine Complete.  Consider MRI of C-spine. - predniSONE (DELTASONE) 50 MG tablet; Take 1 tablet (50 mg total) by mouth daily with breakfast.  Dispense: 5 tablet; Refill: 0 - Ambulatory referral to Neurosurgery  5. Left lumbar radiculopathy Persistent symptoms despite physical therapy and treatment. - Ambulatory referral to Neurosurgery  6. Acquired hypothyroidism - TSH  7. Encounter for immunization - Flu Vaccine MDCK QUAD PF    Meds ordered this encounter  Medications  . lisinopril (ZESTRIL) 20 MG tablet    Sig: Take 1 tablet (20 mg total) by mouth daily.    Dispense:  30  tablet    Refill:  1  . predniSONE (DELTASONE) 50 MG tablet    Sig: Take 1 tablet (50 mg total) by mouth daily with breakfast.    Dispense:  5 tablet    Refill:  0    Orders Placed This  Encounter  Procedures  . DG Cervical Spine Complete  . Flu Vaccine MDCK QUAD PF  . CBC with Differential/Platelet  . Comprehensive metabolic panel  . Hemoglobin A1c  . Lipid panel  . TSH  . Cardiovascular Risk Assessment  . Ambulatory referral to Neurosurgery  . POCT UA - Microalbumin    Follow-up: Return in about 6 weeks (around 06/27/2020) for neck pain/lumbar pain.  An After Visit Summary was printed and given to the patient.  Rochel Brome, MD [facility] [phone]

## 2020-05-16 NOTE — Patient Instructions (Signed)
Increase lisinopril 20 mg once daily.  Get c spine xray  Start prednisone 50 mg once daily x 5 days.

## 2020-05-17 LAB — CBC WITH DIFFERENTIAL/PLATELET
Basophils Absolute: 0.1 10*3/uL (ref 0.0–0.2)
Basos: 1 %
EOS (ABSOLUTE): 0.3 10*3/uL (ref 0.0–0.4)
Eos: 7 %
Hematocrit: 36.9 % — ABNORMAL LOW (ref 37.5–51.0)
Hemoglobin: 12.9 g/dL — ABNORMAL LOW (ref 13.0–17.7)
Immature Grans (Abs): 0 10*3/uL (ref 0.0–0.1)
Immature Granulocytes: 0 %
Lymphocytes Absolute: 1.2 10*3/uL (ref 0.7–3.1)
Lymphs: 25 %
MCH: 31.4 pg (ref 26.6–33.0)
MCHC: 35 g/dL (ref 31.5–35.7)
MCV: 90 fL (ref 79–97)
Monocytes Absolute: 0.4 10*3/uL (ref 0.1–0.9)
Monocytes: 8 %
Neutrophils Absolute: 2.9 10*3/uL (ref 1.4–7.0)
Neutrophils: 59 %
Platelets: 248 10*3/uL (ref 150–450)
RBC: 4.11 x10E6/uL — ABNORMAL LOW (ref 4.14–5.80)
RDW: 12.2 % (ref 11.6–15.4)
WBC: 4.9 10*3/uL (ref 3.4–10.8)

## 2020-05-17 LAB — COMPREHENSIVE METABOLIC PANEL
ALT: 38 IU/L (ref 0–44)
AST: 33 IU/L (ref 0–40)
Albumin/Globulin Ratio: 2.3 — ABNORMAL HIGH (ref 1.2–2.2)
Albumin: 4.9 g/dL (ref 3.8–4.9)
Alkaline Phosphatase: 65 IU/L (ref 44–121)
BUN/Creatinine Ratio: 12 (ref 9–20)
BUN: 13 mg/dL (ref 6–24)
Bilirubin Total: 0.4 mg/dL (ref 0.0–1.2)
CO2: 22 mmol/L (ref 20–29)
Calcium: 10 mg/dL (ref 8.7–10.2)
Chloride: 101 mmol/L (ref 96–106)
Creatinine, Ser: 1.07 mg/dL (ref 0.76–1.27)
GFR calc Af Amer: 89 mL/min/{1.73_m2} (ref >59)
GFR calc non Af Amer: 77 mL/min/{1.73_m2} (ref >59)
Globulin, Total: 2.1 g/dL (ref 1.5–4.5)
Glucose: 114 mg/dL — ABNORMAL HIGH (ref 65–99)
Potassium: 4.3 mmol/L (ref 3.5–5.2)
Sodium: 139 mmol/L (ref 134–144)
Total Protein: 7 g/dL (ref 6.0–8.5)

## 2020-05-17 LAB — HEMOGLOBIN A1C
Est. average glucose Bld gHb Est-mCnc: 126 mg/dL
Hgb A1c MFr Bld: 6 % — ABNORMAL HIGH (ref 4.8–5.6)

## 2020-05-17 LAB — TSH: TSH: 1.42 u[IU]/mL (ref 0.450–4.500)

## 2020-05-17 LAB — CARDIOVASCULAR RISK ASSESSMENT

## 2020-05-17 LAB — LIPID PANEL
Chol/HDL Ratio: 3.9 ratio (ref 0.0–5.0)
Cholesterol, Total: 148 mg/dL (ref 100–199)
HDL: 38 mg/dL — ABNORMAL LOW (ref >39)
LDL Chol Calc (NIH): 67 mg/dL (ref 0–99)
Triglycerides: 271 mg/dL — ABNORMAL HIGH (ref 0–149)
VLDL Cholesterol Cal: 43 mg/dL — ABNORMAL HIGH (ref 5–40)

## 2020-05-19 ENCOUNTER — Other Ambulatory Visit: Payer: Self-pay

## 2020-05-20 ENCOUNTER — Other Ambulatory Visit: Payer: Self-pay | Admitting: Family Medicine

## 2020-05-20 MED ORDER — FENOFIBRATE 134 MG PO CAPS: 134.0000 mg | ORAL_CAPSULE | Freq: Every day | ORAL | 0 refills | Status: DC

## 2020-05-21 ENCOUNTER — Encounter: Payer: Self-pay | Admitting: Family Medicine

## 2020-05-22 ENCOUNTER — Other Ambulatory Visit: Payer: Self-pay

## 2020-05-22 MED ORDER — FENOFIBRATE 145 MG PO TABS
145.0000 mg | ORAL_TABLET | Freq: Every day | ORAL | 0 refills | Status: DC
Start: 2020-05-22 — End: 2020-08-29

## 2020-05-22 MED ORDER — ESZOPICLONE 3 MG PO TABS
3.0000 mg | ORAL_TABLET | Freq: Every day | ORAL | 1 refills | Status: DC
Start: 2020-05-22 — End: 2020-11-27

## 2020-05-28 ENCOUNTER — Other Ambulatory Visit: Payer: Self-pay | Admitting: Family Medicine

## 2020-05-30 ENCOUNTER — Telehealth (INDEPENDENT_AMBULATORY_CARE_PROVIDER_SITE_OTHER): Payer: Commercial Managed Care - PPO | Admitting: Nurse Practitioner

## 2020-05-30 ENCOUNTER — Encounter: Payer: Self-pay | Admitting: Nurse Practitioner

## 2020-05-30 VITALS — Ht 72.0 in | Wt 261.0 lb

## 2020-05-30 DIAGNOSIS — J309 Allergic rhinitis, unspecified: Secondary | ICD-10-CM

## 2020-05-30 DIAGNOSIS — J029 Acute pharyngitis, unspecified: Secondary | ICD-10-CM

## 2020-05-30 DIAGNOSIS — I10 Essential (primary) hypertension: Secondary | ICD-10-CM | POA: Diagnosis not present

## 2020-05-30 DIAGNOSIS — J019 Acute sinusitis, unspecified: Secondary | ICD-10-CM

## 2020-05-30 DIAGNOSIS — Z85528 Personal history of other malignant neoplasm of kidney: Secondary | ICD-10-CM

## 2020-05-30 MED ORDER — FLUTICASONE PROPIONATE 50 MCG/ACT NA SUSP: 2.0000 | Freq: Every day | NASAL | 6 refills | Status: DC

## 2020-05-30 MED ORDER — AMOXICILLIN 875 MG PO TABS: 875.0000 mg | ORAL_TABLET | Freq: Two times a day (BID) | ORAL | 0 refills | Status: AC

## 2020-05-30 NOTE — Progress Notes (Signed)
Virtual Visit via Telephone Note   This visit type was conducted due to national recommendations for restrictions regarding the COVID-19 Pandemic (e.g. social distancing) in an effort to limit this patient's exposure and mitigate transmission in our community.  Due to his co-morbid illnesses, this patient is at least at moderate risk for complications without adequate follow up.  This format is felt to be most appropriate for this patient at this time.  The patient did not have access to video technology/had technical difficulties with video requiring transitioning to audio format only (telephone).  All issues noted in this document were discussed and addressed.  No physical exam could be performed with this format.  Patient verbally consented to a telehealth visit.   Date:  05/30/2020   ID:  Frederick Martinez, DOB [birth year], MRN [id number]  Patient Location: Home Provider Location: Office/Clinic  PCP:  Rochel Brome, MD   Evaluation Performed:  Established patient, Acute Telemedicine visit  Chief Complaint: Sore throat  History of Present Illness:    Frederick Martinez is a 59 y.o. male with two-day onset sinus pressure/pain and congestion. He has a severe sore throat with swollen uvula and painful swallowing. Sore throat pain awakens him at night. Treatment includes Chloraseptic spray. He has recently traveled [place]. He has received seasonal flu vaccine, COVID-19 immunizations, and booster. He is a non-smoker. Medical history includes hypertension, hyperlipidemia, Type 2DM, CAD, renal carcinoma, morbid obesity,and allergic rhinitis. Pt reluctant to take OTC cold remedies due to history of hypertension.  The patient does have symptoms concerning for COVID-19 infection (fever, chills, cough, or new shortness of breath).    Past Medical History:  Diagnosis Date  . Anxiety   . Arthritis   . Cancer [facility])    kidney cancer  . Depression   . Diabetes mellitus without complication ([place]) 6063    type II  . Essential hypertension   . GERD (gastroesophageal reflux disease)   . History of kidney stones   . Mixed hyperlipidemia     Past Surgical History:  Procedure Laterality Date  . CHOLECYSTECTOMY  2008  . PARTIAL NEPHRECTOMY  2002   kidney cancer  . SHOULDER SURGERY  2008    Family History  Problem Relation Age of Onset  . Heart disease Other   . Stroke Other   . Hypertension Other   . Diabetes Other   . CAD Other     Social History   Socioeconomic History  . Marital status: Married    Spouse name: Not on file  . Number of children: 2  . Years of education: Not on file  . Highest education level: Not on file  Occupational History  . Occupation: maintance  Tobacco Use  . Smoking status: Never Smoker  . Smokeless tobacco: Never Used  Substance and Sexual Activity  . Alcohol use: Yes    Comment: has not drank since 2011  . Drug use: No  . Sexual activity: Not on file  Other Topics Concern  . Not on file  Social History Narrative  . Not on file   Social Determinants of Health   Financial Resource Strain: Not on file  Food Insecurity: Not on file  Transportation Needs: Not on file  Physical Activity: Not on file  Stress: Not on file  Social Connections: Not on file  Intimate Partner Violence: Not on file    Outpatient Medications Prior to Visit  Medication Sig Dispense Refill  . amitriptyline (ELAVIL) 25 MG tablet TAKE 1 TABLET AT  BEDTIME 90 tablet 3  . amLODipine (NORVASC) 10 MG tablet TAKE 1 TABLET DAILY (Patient taking differently: Take 10 mg by mouth. Take one every other day) 90 tablet 3  . Ascorbic Acid (VITAMIN C) 1000 MG tablet Take 1,000 mg by mouth daily.    Marland Kitchen atorvastatin (LIPITOR) 10 MG tablet Take 10 mg by mouth daily.    Marland Kitchen buPROPion (WELLBUTRIN XL) 150 MG 24 hr tablet TAKE 1 TABLET DAILY 90 tablet 3  . clonazePAM (KLONOPIN) 0.5 MG tablet TAKE 1 TABLET BY MOUTH 2 TIMES DAILY. 60 tablet 0  . DEXILANT 60 MG capsule TAKE 1 CAPSULE DAILY  90 capsule 3  . Eszopiclone 3 MG TABS Take 1 tablet (3 mg total) by mouth at bedtime. Take immediately before bedtime 90 tablet 1  . ezetimibe (ZETIA) 10 MG tablet TAKE 1 TABLET DAILY 90 tablet 3  . famotidine (PEPCID) 40 MG tablet TAKE 1 TABLET BY MOUTH 2 TIMES DAILY. 60 tablet 1  . fenofibrate (TRICOR) 145 MG tablet Take 1 tablet (145 mg total) by mouth daily. 90 tablet 0  . furosemide (LASIX) 20 MG tablet TAKE 1 TABLET DAILY 90 tablet 3  . HYDROcodone-acetaminophen (NORCO/VICODIN) 5-325 MG tablet Take 1 tablet by mouth every 6 (six) hours as needed for moderate pain. 40 tablet 0  . levocetirizine (XYZAL) 5 MG tablet Take by mouth every evening.    Marland Kitchen levothyroxine (SYNTHROID) 50 MCG tablet TAKE 1 TABLET DAILY 90 tablet 3  . LINZESS 290 MCG CAPS capsule TAKE 1 CAPSULE DAILY 30 MINUTES BEFORE THE FIRST MEAL OF THE DAY 90 capsule 3  . lisinopril (ZESTRIL) 20 MG tablet Take 1 tablet (20 mg total) by mouth daily. 30 tablet 1  . meloxicam (MOBIC) 15 MG tablet Take 15 mg by mouth daily.    . metFORMIN (GLUCOPHAGE) 500 MG tablet TAKE 1 TABLET TWICE A DAY 180 tablet 3  . methocarbamol (ROBAXIN) 750 MG tablet TAKE 1 TABLET BY MOUTH 4 TIMES DAILY. 120 tablet 2  . metoprolol succinate (TOPROL-XL) 25 MG 24 hr tablet TAKE 1 TABLET DAILY 90 tablet 3  . Multiple Vitamins-Minerals (CENTRUM SILVER 50+MEN PO) Take by mouth.    . pregabalin (LYRICA) 300 MG capsule Take 1 capsule (300 mg total) by mouth 2 (two) times daily. 60 capsule 0  . promethazine (PHENERGAN) 25 MG tablet TAKE 1 TABLET BY MOUTH EVERY 6 HOURS AS NEEDED FOR NAUSEA AND VOMITING 60 tablet 0  . traZODone (DESYREL) 50 MG tablet TAKE 1 TO 2 TABLETS AT BEDTIME 180 tablet 1  . valACYclovir (VALTREX) 1000 MG tablet TAKE 1 TABLET BY MOUTH TWICE DAILY FOR 7 DAYS AS NEEDED FOR COLD SORES. 14 tablet 2  . VASCEPA 1 g capsule TAKE 2 CAPSULES TWICE A DAY WITH FOOD SWALLOWING WHOLE, DO NOT CHEW, OPEN, DISSOLVE AND/OR CRUSH 360 capsule 3  . venlafaxine XR  (EFFEXOR-XR) 75 MG 24 hr capsule TAKE 3 CAPSULES DAILY (Patient taking differently: Take 150 mg by mouth. Take 2 every am.) 270 capsule 3  . vitamin B-12 (CYANOCOBALAMIN) 250 MCG tablet Take 250 mcg by mouth daily.    . predniSONE (DELTASONE) 50 MG tablet Take 1 tablet (50 mg total) by mouth daily with breakfast. 5 tablet 0   No facility-administered medications prior to visit.    Allergies:   Patient has no known allergies.   Social History   Tobacco Use  . Smoking status: Never Smoker  . Smokeless tobacco: Never Used  Substance Use Topics  . Alcohol  use: Yes    Comment: has not drank since 2011  . Drug use: No     Review of Systems  Constitutional: Positive for malaise/fatigue. Negative for fever.  HENT: Positive for congestion, sinus pain and sore throat (severe).        Swollen uvula, post-nasal drip  Respiratory: Positive for cough. Negative for sputum production, shortness of breath and wheezing.   Cardiovascular: Negative for chest pain and leg swelling.  Gastrointestinal: Negative for abdominal pain, diarrhea, nausea and vomiting.  Genitourinary: Negative for dysuria, frequency and urgency.  Musculoskeletal: Negative for falls, joint pain and myalgias.  Skin: Negative for rash.  Neurological: Positive for headaches. Negative for dizziness.     Labs/Other Tests and Data Reviewed:    Recent Labs: 05/16/2020: ALT 38; BUN 13; Creatinine, Ser 1.07; Hemoglobin 12.9; Platelets 248; Potassium 4.3; Sodium 139; TSH 1.420   Recent Lipid Panel Lab Results  Component Value Date/Time   CHOL 148 05/16/2020 08:22 AM   TRIG 271 (H) 05/16/2020 08:22 AM   HDL 38 (L) 05/16/2020 08:22 AM   CHOLHDL 3.9 05/16/2020 08:22 AM   LDLCALC 67 05/16/2020 08:22 AM    Wt Readings from Last 3 Encounters:  05/30/20 261 lb (118.4 kg)  05/16/20 261 lb (118.4 kg)  02/20/20 262 lb (118.8 kg)     Objective:    Vital Signs:  Ht 6' (1.829 m)   Wt 261 lb (118.4 kg)   BMI 35.40 kg/m     Physical Exam No physical exam performed due to telemedicine visit  ASSESSMENT & PLAN:     1. Acute non-recurrent sinusitis, unspecified location - amoxicillin (AMOXIL) 875 MG tablet; Take 1 tablet (875 mg total) by mouth 2 (two) times daily for 5 days.  Dispense: 10 tablet; Refill: 0  2. Pharyngitis, unspecified etiology - amoxicillin (AMOXIL) 875 MG tablet; Take 1 tablet (875 mg total) by mouth 2 (two) times daily for 5 days.  Dispense: 10 tablet; Refill: 0 -Warm salt water gargles  3. Allergic rhinitis, unspecified seasonality, unspecified trigger - fluticasone (FLONASE) 50 MCG/ACT nasal spray; Place 2 sprays into both nostrils daily.  Dispense: 16 g; Refill: 6  4. Hypertension, essential -Continue current medications  5. History of renal carcinoma  COVID-19 Education: The signs and symptoms of COVID-19 were discussed with the patient and how to seek care for testing (follow up with PCP or arrange E-visit). The importance of social distancing was discussed today.  Tylenol as directed on package Warm salt water gargles Cepacol lozenges as needed Notify office if symptoms fail to improve or worsen  Time:  08:47-08:52 telephone call Today, I have spent 5 minutes with the patient with telehealth technology.    Follow Up:  Virtual Visit  prn if symptoms fail to improve or worsen  Signed, Rip Harbour, NP  05/30/2020 8:43 AM    [place]

## 2020-05-30 NOTE — Progress Notes (Deleted)
   Virtual Visit via Telephone Note        History of Present Illness:                            Labs/Other Tests and Data Reviewed:         Objective:         ASSESSMENT & PLAN:

## 2020-06-26 ENCOUNTER — Other Ambulatory Visit: Payer: Self-pay | Admitting: Physician Assistant

## 2020-06-26 ENCOUNTER — Other Ambulatory Visit: Payer: Self-pay | Admitting: Family Medicine

## 2020-06-26 DIAGNOSIS — R1013 Epigastric pain: Secondary | ICD-10-CM

## 2020-06-27 ENCOUNTER — Ambulatory Visit (INDEPENDENT_AMBULATORY_CARE_PROVIDER_SITE_OTHER): Payer: Managed Care, Other (non HMO) | Admitting: Family Medicine

## 2020-06-27 DIAGNOSIS — I10 Essential (primary) hypertension: Secondary | ICD-10-CM

## 2020-06-27 NOTE — Progress Notes (Signed)
Cancelled. kc 

## 2020-07-04 ENCOUNTER — Other Ambulatory Visit: Payer: Self-pay | Admitting: Family Medicine

## 2020-07-10 ENCOUNTER — Other Ambulatory Visit: Payer: Self-pay | Admitting: Family Medicine

## 2020-07-24 ENCOUNTER — Other Ambulatory Visit: Payer: Self-pay | Admitting: Physician Assistant

## 2020-07-24 ENCOUNTER — Other Ambulatory Visit: Payer: Self-pay | Admitting: Family Medicine

## 2020-07-24 DIAGNOSIS — R1013 Epigastric pain: Secondary | ICD-10-CM

## 2020-08-07 ENCOUNTER — Telehealth: Payer: Self-pay

## 2020-08-07 NOTE — Telephone Encounter (Signed)
Wife called stating pt's dexilant will no longer be covered. Pt's insurance changed from [facility] to [place]. Wife did not know of any medication that needed to be tried Beforehand. She is requesting [place] to do something so insurance will cover.  Stated he would be out Sunday.   Royce Macadamia, [place] 08/07/20 5:11 PM

## 2020-08-08 ENCOUNTER — Other Ambulatory Visit: Payer: Self-pay | Admitting: Family Medicine

## 2020-08-08 MED ORDER — LANSOPRAZOLE 30 MG PO CPDR: 30.0000 mg | DELAYED_RELEASE_CAPSULE | Freq: Two times a day (BID) | ORAL | 2 refills | Status: DC

## 2020-08-08 NOTE — Telephone Encounter (Signed)
Patient's wife was informed. She said please send to [facility].

## 2020-08-08 NOTE — Telephone Encounter (Signed)
Send rx lansoprazole 30 mg one twice a day in place of dexilant. K[facility]

## 2020-08-18 ENCOUNTER — Other Ambulatory Visit: Payer: Self-pay | Admitting: Family Medicine

## 2020-08-19 ENCOUNTER — Other Ambulatory Visit: Payer: Self-pay | Admitting: Family Medicine

## 2020-08-25 ENCOUNTER — Other Ambulatory Visit: Payer: Self-pay | Admitting: Family Medicine

## 2020-08-25 ENCOUNTER — Telehealth: Payer: Self-pay

## 2020-08-25 MED ORDER — ESOMEPRAZOLE MAGNESIUM 40 MG PO CPDR: 40.0000 mg | DELAYED_RELEASE_CAPSULE | Freq: Two times a day (BID) | ORAL | 0 refills | Status: DC

## 2020-08-25 NOTE — Telephone Encounter (Signed)
Wife   Called wife. Wife states insurance would not cover dexilant anymore, changed to prevacid, pepcid, and OTC nexium. Pt is vomiting in sleep. Has seen Dr. Melina Copa in the past but wife doesn't remember when. He is nauseous during the day. Has an appointment this Friday, but worried about vomiting in sleep.   Spoke with Dr. Tobie Poet, will call in Nexium for pt and stop prevacid. Also advised wife she can call Dr. Carmie End office to see if they can see him or help.   Royce Macadamia, [place] 08/25/20 3:08 PM

## 2020-08-27 NOTE — Progress Notes (Signed)
Subjective:  Patient ID: Frederick Martinez, male    DOB: 10-26-1960  Age: 60 y.o. MRN: [id number]  Chief Complaint  Patient presents with  . Diabetes  . Hypertension  . Hyperlipidemia    HPI Hypertension, essential Taking lisinopril, metoprolol and amlodipine  Type 2 diabetes mellitus with polyneuropathy (HCC) Taking metformin 500 mg one twice a day. Tries to eat healthy. 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 No other formal exercise program.   Mixed hyperlipidemia Takes lipitor 10 mg once daily, zetia 10 mg once once daily, vascepa 1 gm 2 capsules twice a day and tricor daily. Low fat diet.   Acquired hypothyroidism Taking synthroid  Gastroesophageal reflux disease without esophagitis:  Pt has been having some acid reflux at night and made him throw up in the middle of the night.  Changed to nexium 40 mg once twice daily from prevacid and continued pepcid. This has helped.   Current Outpatient Medications on File Prior to Visit  Medication Sig Dispense Refill  . amitriptyline (ELAVIL) 25 MG tablet TAKE 1 TABLET AT BEDTIME 90 tablet 3  . amLODipine (NORVASC) 10 MG tablet TAKE 1 TABLET DAILY (Patient taking differently: Take 10 mg by mouth. Take one every other day) 90 tablet 3  . Ascorbic Acid (VITAMIN C) 1000 MG tablet Take 1,000 mg by mouth daily.    Marland Kitchen atorvastatin (LIPITOR) 10 MG tablet Take 10 mg by mouth daily.    Marland Kitchen buPROPion (WELLBUTRIN XL) 150 MG 24 hr tablet TAKE 1 TABLET DAILY 90 tablet 3  . clonazePAM (KLONOPIN) 0.5 MG tablet TAKE 1 TABLET BY MOUTH 2 TIMES DAILY. 60 tablet 3  . esomeprazole (NEXIUM) 40 MG capsule Take 1 capsule (40 mg total) by mouth 2 (two) times daily before a meal. 60 capsule 0  . Eszopiclone 3 MG TABS Take 1 tablet (3 mg total) by mouth at bedtime. Take immediately before bedtime 90 tablet 1  . ezetimibe (ZETIA) 10 MG tablet TAKE 1 TABLET DAILY 90 tablet 3  . fluticasone (FLONASE) 50 MCG/ACT nasal spray Place 2 sprays into both nostrils daily. 16 g 6   . furosemide (LASIX) 20 MG tablet TAKE 1 TABLET DAILY 90 tablet 3  . levocetirizine (XYZAL) 5 MG tablet Take by mouth every evening.    Marland Kitchen levothyroxine (SYNTHROID) 50 MCG tablet TAKE 1 TABLET DAILY 90 tablet 3  . LINZESS 290 MCG CAPS capsule TAKE 1 CAPSULE DAILY 30 MINUTES BEFORE THE FIRST MEAL OF THE DAY 90 capsule 3  . lisinopril (ZESTRIL) 20 MG tablet TAKE 1 TABLET BY MOUTH DAILY. 90 tablet 0  . meloxicam (MOBIC) 15 MG tablet Take 15 mg by mouth daily.    . metFORMIN (GLUCOPHAGE) 500 MG tablet TAKE 1 TABLET TWICE A DAY 180 tablet 3  . methocarbamol (ROBAXIN) 750 MG tablet TAKE 1 TABLET BY MOUTH 4 TIMES DAILY. 120 tablet 1  . Multiple Vitamins-Minerals (CENTRUM SILVER 50+MEN PO) Take by mouth.    . pregabalin (LYRICA) 300 MG capsule TAKE 1 CAPSULE BY MOUTH TWICE A DAY 180 capsule 0  . promethazine (PHENERGAN) 25 MG tablet TAKE 1 TABLET BY MOUTH EVERY 6 HOURS AS NEEDED FOR NAUSEA AND VOMITING 60 tablet 0  . traZODone (DESYREL) 50 MG tablet TAKE 1 TO 2 TABLETS AT BEDTIME 180 tablet 1  . valACYclovir (VALTREX) 1000 MG tablet TAKE 1 TABLET BY MOUTH TWICE DAILY FOR 7 DAYS AS NEEDED FOR COLD SORES. 14 tablet 2  . venlafaxine XR (EFFEXOR-XR) 75 MG 24 hr  capsule TAKE 3 CAPSULES DAILY (Patient taking differently: Take 150 mg by mouth. Take 2 every am.) 270 capsule 3  . vitamin B-12 (CYANOCOBALAMIN) 250 MCG tablet Take 250 mcg by mouth daily.     No current facility-administered medications on file prior to visit.   Past Medical History:  Diagnosis Date  . Anxiety   . Arthritis   . Cancer [facility])    kidney cancer  . Depression   . Diabetes mellitus without complication ([place]) 9326   type II  . Essential hypertension   . GERD (gastroesophageal reflux disease)   . History of kidney stones   . Mixed hyperlipidemia    Past Surgical History:  Procedure Laterality Date  . CHOLECYSTECTOMY  2008  . PARTIAL NEPHRECTOMY  2002   kidney cancer  . SHOULDER SURGERY  2008    Family History  Problem  Relation Age of Onset  . Heart disease Other   . Stroke Other   . Hypertension Other   . Diabetes Other   . CAD Other    Social History   Socioeconomic History  . Marital status: Married    Spouse name: Not on file  . Number of children: 2  . Years of education: Not on file  . Highest education level: Not on file  Occupational History  . Occupation: maintance  Tobacco Use  . Smoking status: Never Smoker  . Smokeless tobacco: Never Used  Substance and Sexual Activity  . Alcohol use: Yes    Comment: has not drank since 2011  . Drug use: No  . Sexual activity: Not on file  Other Topics Concern  . Not on file  Social History Narrative  . Not on file   Social Determinants of Health   Financial Resource Strain: Not on file  Food Insecurity: Not on file  Transportation Needs: Not on file  Physical Activity: Not on file  Stress: Not on file  Social Connections: Not on file    Review of Systems  Constitutional: Negative for chills, diaphoresis, fatigue and fever.  HENT: Positive for congestion. Negative for ear pain and sore throat.   Respiratory: Negative for cough and shortness of breath.   Cardiovascular: Negative for chest pain and leg swelling.  Gastrointestinal: Negative for abdominal pain, constipation, diarrhea, nausea and vomiting.  Endocrine: Negative for polydipsia, polyphagia and polyuria.  Genitourinary: Negative for dysuria and urgency.  Musculoskeletal: Positive for arthralgias and back pain. Negative for myalgias.  Neurological: Positive for numbness (left hand: positional) and headaches (occasional). Negative for dizziness.  Psychiatric/Behavioral: Negative for dysphoric mood. The patient is not nervous/anxious.      Objective:  BP 116/64   Pulse 78   Temp (!) 97.3 F (36.3 C)   Resp 16   Ht 6' (1.829 m)   Wt 268 lb (121.6 kg)   BMI 36.35 kg/m   BP/Weight 08/29/2020 05/30/2020 71/07/4578  Systolic BP 998 - 338  Diastolic BP 64 - 70  Wt. (Lbs)  268 261 261  BMI 36.35 35.4 35.4    Physical Exam Vitals reviewed.  Constitutional:      Appearance: Normal appearance. He is obese.  Neck:     Vascular: No carotid bruit.  Cardiovascular:     Rate and Rhythm: Normal rate and regular rhythm.     Heart sounds: Normal heart sounds. No murmur heard.   Pulmonary:     Effort: Pulmonary effort is normal.     Breath sounds: Normal breath sounds.  Abdominal:  General: Abdomen is flat. Bowel sounds are normal.     Palpations: Abdomen is soft.     Tenderness: There is no abdominal tenderness.  Musculoskeletal:        General: Tenderness (lumbar) present.  Neurological:     Mental Status: He is alert and oriented to person, place, and time.  Psychiatric:        Mood and Affect: Mood normal.        Behavior: Behavior normal.     Diabetic Foot Exam - Simple   Simple Foot Form Diabetic Foot exam was performed with the following findings: Yes 08/29/2020  8:05 AM  Visual Inspection No deformities, no ulcerations, no other skin breakdown bilaterally: Yes Sensation Testing Intact to touch and monofilament testing bilaterally: Yes Pulse Check Posterior Tibialis and Dorsalis pulse intact bilaterally: Yes Comments      Lab Results  Component Value Date   WBC 4.3 09/05/2020   HGB 11.4 (L) 09/05/2020   HCT 34.1 (L) 09/05/2020   PLT 266 09/05/2020   GLUCOSE 132 (H) 09/05/2020   CHOL 167 08/29/2020   TRIG 277 (H) 08/29/2020   HDL 35 (L) 08/29/2020   LDLCALC 86 08/29/2020   ALT 51 (H) 09/05/2020   AST 41 (H) 09/05/2020   NA 141 09/05/2020   K 4.2 09/05/2020   CL 103 09/05/2020   CREATININE 1.50 (H) 09/05/2020   BUN 16 09/05/2020   CO2 20 09/05/2020   TSH 1.420 05/16/2020   INR 1.04 03/28/2014   HGBA1C 5.8 (H) 08/29/2020   MICROALBUR 80 08/29/2020      Assessment & Plan:   1. Hypertension, essential Well controlled.  No changes to medicines.  Continue to work on eating a healthy diet and exercise.  Labs drawn  today.  - Comprehensive metabolic panel  2. Type 2 diabetes mellitus with polyneuropathy (HCC) Control: good Recommend check feet daily. Recommend annual eye exams. Medicines: continue current medications Continue to work on eating a healthy diet and exercise.  Labs drawn today.   - Hemoglobin A1c - CBC with Differential/Platelet - POCT UA - Microalbumin  3. Mixed hyperlipidemia Well controlled except triglycerides elevated No changes to medicines.  Strongly recommended work on diet and exercise as medications are maxed out.  Labs drawn today.  - Lipid panel  4. Acquired hypothyroidism The current medical regimen is effective;  continue present plan and medications.  5. Gastroesophageal reflux disease without esophagitis  6. Lumbar back pain Changed to nexium 40 mg once twice daily earlier this week. Discontinued prevacid  If would like to stop pepcid and GERD is controlled, may remain off. If has breakthrough reflux add pepcid back with nexium.  Orders Placed This Encounter  Procedures  . Lipid panel  . Hemoglobin A1c  . Comprehensive metabolic panel  . CBC with Differential/Platelet  . Cardiovascular Risk Assessment  . POCT UA - Microalbumin     Follow-up: Return in about 3 months (around 11/29/2020) for fasting.Marland Kitchen  An After Visit Summary was printed and given to the patient.  Rochel Brome, MD [facility] [phone]

## 2020-08-29 ENCOUNTER — Other Ambulatory Visit: Payer: Self-pay

## 2020-08-29 ENCOUNTER — Ambulatory Visit: Payer: Managed Care, Other (non HMO) | Admitting: Family Medicine

## 2020-08-29 VITALS — BP 116/64 | HR 78 | Temp 97.3°F | Resp 16 | Ht 72.0 in | Wt 268.0 lb

## 2020-08-29 DIAGNOSIS — E039 Hypothyroidism, unspecified: Secondary | ICD-10-CM | POA: Diagnosis not present

## 2020-08-29 DIAGNOSIS — M545 Low back pain, unspecified: Secondary | ICD-10-CM

## 2020-08-29 DIAGNOSIS — I10 Essential (primary) hypertension: Secondary | ICD-10-CM | POA: Diagnosis not present

## 2020-08-29 DIAGNOSIS — E782 Mixed hyperlipidemia: Secondary | ICD-10-CM | POA: Diagnosis not present

## 2020-08-29 DIAGNOSIS — K219 Gastro-esophageal reflux disease without esophagitis: Secondary | ICD-10-CM

## 2020-08-29 DIAGNOSIS — E1142 Type 2 diabetes mellitus with diabetic polyneuropathy: Secondary | ICD-10-CM | POA: Diagnosis not present

## 2020-08-29 LAB — POCT UA - MICROALBUMIN: Microalbumin Ur, POC: 80 mg/L

## 2020-08-29 NOTE — Patient Instructions (Addendum)
Changed to nexium 40 mg once twice daily earlier this week. Discontinued prevacid  If stopped pepcid and GERD is controlled, may remain off. If has breakthrough reflux add pepcid back with nexium.

## 2020-08-30 LAB — COMPREHENSIVE METABOLIC PANEL
ALT: 44 IU/L (ref 0–44)
AST: 34 IU/L (ref 0–40)
Albumin/Globulin Ratio: 2 (ref 1.2–2.2)
Albumin: 4.7 g/dL (ref 3.8–4.9)
Alkaline Phosphatase: 35 IU/L — ABNORMAL LOW (ref 44–121)
BUN/Creatinine Ratio: 16 (ref 9–20)
BUN: 26 mg/dL — ABNORMAL HIGH (ref 6–24)
Bilirubin Total: 0.3 mg/dL (ref 0.0–1.2)
CO2: 23 mmol/L (ref 20–29)
Calcium: 10 mg/dL (ref 8.7–10.2)
Chloride: 103 mmol/L (ref 96–106)
Creatinine, Ser: 1.62 mg/dL — ABNORMAL HIGH (ref 0.76–1.27)
Globulin, Total: 2.3 g/dL (ref 1.5–4.5)
Glucose: 123 mg/dL — ABNORMAL HIGH (ref 65–99)
Potassium: 4.3 mmol/L (ref 3.5–5.2)
Sodium: 142 mmol/L (ref 134–144)
Total Protein: 7 g/dL (ref 6.0–8.5)
eGFR: 49 mL/min/{1.73_m2} — ABNORMAL LOW (ref >59)

## 2020-08-30 LAB — CBC WITH DIFFERENTIAL/PLATELET
Basophils Absolute: 0.1 x10E3/uL (ref 0.0–0.2)
Basos: 1 %
EOS (ABSOLUTE): 0.3 x10E3/uL (ref 0.0–0.4)
Eos: 7 %
Hematocrit: 34 % — ABNORMAL LOW (ref 37.5–51.0)
Hemoglobin: 11.4 g/dL — ABNORMAL LOW (ref 13.0–17.7)
Immature Grans (Abs): 0 x10E3/uL (ref 0.0–0.1)
Immature Granulocytes: 0 %
Lymphocytes Absolute: 1.2 x10E3/uL (ref 0.7–3.1)
Lymphs: 28 %
MCH: 31.1 pg (ref 26.6–33.0)
MCHC: 33.5 g/dL (ref 31.5–35.7)
MCV: 93 fL (ref 79–97)
Monocytes Absolute: 0.4 x10E3/uL (ref 0.1–0.9)
Monocytes: 10 %
Neutrophils Absolute: 2.2 x10E3/uL (ref 1.4–7.0)
Neutrophils: 54 %
Platelets: 265 x10E3/uL (ref 150–450)
RBC: 3.67 x10E6/uL — ABNORMAL LOW (ref 4.14–5.80)
RDW: 12.3 % (ref 11.6–15.4)
WBC: 4.1 x10E3/uL (ref 3.4–10.8)

## 2020-08-30 LAB — LIPID PANEL
Chol/HDL Ratio: 4.8 ratio (ref 0.0–5.0)
Cholesterol, Total: 167 mg/dL (ref 100–199)
HDL: 35 mg/dL — ABNORMAL LOW (ref >39)
LDL Chol Calc (NIH): 86 mg/dL (ref 0–99)
Triglycerides: 277 mg/dL — ABNORMAL HIGH (ref 0–149)
VLDL Cholesterol Cal: 46 mg/dL — ABNORMAL HIGH (ref 5–40)

## 2020-08-30 LAB — HEMOGLOBIN A1C
Est. average glucose Bld gHb Est-mCnc: 120 mg/dL
Hgb A1c MFr Bld: 5.8 % — ABNORMAL HIGH (ref 4.8–5.6)

## 2020-08-30 LAB — CARDIOVASCULAR RISK ASSESSMENT

## 2020-08-30 MED ORDER — FENOFIBRATE 145 MG PO TABS: 145.0000 mg | ORAL_TABLET | Freq: Every day | ORAL | 0 refills | Status: DC

## 2020-08-30 MED ORDER — HYDROCODONE-ACETAMINOPHEN 5-325 MG PO TABS
1.0000 | ORAL_TABLET | Freq: Four times a day (QID) | ORAL | 0 refills | Status: DC | PRN
Start: 2020-08-30 — End: 2020-10-13

## 2020-08-31 ENCOUNTER — Encounter: Payer: Self-pay | Admitting: Family Medicine

## 2020-09-02 ENCOUNTER — Other Ambulatory Visit: Payer: Self-pay | Admitting: Family Medicine

## 2020-09-04 ENCOUNTER — Other Ambulatory Visit: Payer: Self-pay

## 2020-09-04 DIAGNOSIS — R944 Abnormal results of kidney function studies: Secondary | ICD-10-CM

## 2020-09-04 NOTE — Progress Notes (Signed)
Results discussed with patient's wife.  He has been taking all three of the medications as prescribed.  He is not taking NSAIDS but he has been taking Tylenol.  She will have him come in tomorrow morning for repeat CBC and CMP

## 2020-09-05 ENCOUNTER — Other Ambulatory Visit: Payer: Managed Care, Other (non HMO)

## 2020-09-05 DIAGNOSIS — R944 Abnormal results of kidney function studies: Secondary | ICD-10-CM

## 2020-09-05 LAB — COMPREHENSIVE METABOLIC PANEL
ALT: 51 IU/L — ABNORMAL HIGH (ref 0–44)
AST: 41 IU/L — ABNORMAL HIGH (ref 0–40)
Albumin/Globulin Ratio: 2.2 (ref 1.2–2.2)
Albumin: 4.8 g/dL (ref 3.8–4.9)
Alkaline Phosphatase: 37 IU/L — ABNORMAL LOW (ref 44–121)
BUN/Creatinine Ratio: 11 (ref 9–20)
BUN: 16 mg/dL (ref 6–24)
Bilirubin Total: 0.3 mg/dL (ref 0.0–1.2)
CO2: 20 mmol/L (ref 20–29)
Calcium: 9.8 mg/dL (ref 8.7–10.2)
Chloride: 103 mmol/L (ref 96–106)
Creatinine, Ser: 1.5 mg/dL — ABNORMAL HIGH (ref 0.76–1.27)
Globulin, Total: 2.2 g/dL (ref 1.5–4.5)
Glucose: 132 mg/dL — ABNORMAL HIGH (ref 65–99)
Potassium: 4.2 mmol/L (ref 3.5–5.2)
Sodium: 141 mmol/L (ref 134–144)
Total Protein: 7 g/dL (ref 6.0–8.5)
eGFR: 54 mL/min/{1.73_m2} — ABNORMAL LOW (ref >59)

## 2020-09-05 LAB — CBC
Hematocrit: 34.1 % — ABNORMAL LOW (ref 37.5–51.0)
Hemoglobin: 11.4 g/dL — ABNORMAL LOW (ref 13.0–17.7)
MCH: 30.5 pg (ref 26.6–33.0)
MCHC: 33.4 g/dL (ref 31.5–35.7)
MCV: 91 fL (ref 79–97)
Platelets: 266 10*3/uL (ref 150–450)
RBC: 3.74 x10E6/uL — ABNORMAL LOW (ref 4.14–5.80)
RDW: 12.2 % (ref 11.6–15.4)
WBC: 4.3 10*3/uL (ref 3.4–10.8)

## 2020-09-09 ENCOUNTER — Other Ambulatory Visit: Payer: Self-pay | Admitting: Family Medicine

## 2020-09-09 ENCOUNTER — Other Ambulatory Visit: Payer: Self-pay

## 2020-09-09 DIAGNOSIS — R944 Abnormal results of kidney function studies: Secondary | ICD-10-CM

## 2020-09-22 ENCOUNTER — Other Ambulatory Visit: Payer: Self-pay | Admitting: Family Medicine

## 2020-09-25 ENCOUNTER — Other Ambulatory Visit: Payer: Self-pay | Admitting: Family Medicine

## 2020-09-25 LAB — HM DIABETES EYE EXAM

## 2020-09-26 ENCOUNTER — Other Ambulatory Visit: Payer: Self-pay

## 2020-09-26 ENCOUNTER — Other Ambulatory Visit: Payer: Managed Care, Other (non HMO)

## 2020-09-26 DIAGNOSIS — R944 Abnormal results of kidney function studies: Secondary | ICD-10-CM

## 2020-09-26 LAB — COMPREHENSIVE METABOLIC PANEL
ALT: 35 IU/L (ref 0–44)
AST: 25 IU/L (ref 0–40)
Albumin/Globulin Ratio: 2.1 (ref 1.2–2.2)
Albumin: 4.5 g/dL (ref 3.8–4.9)
Alkaline Phosphatase: 37 IU/L — ABNORMAL LOW (ref 44–121)
BUN/Creatinine Ratio: 9 (ref 9–20)
BUN: 13 mg/dL (ref 6–24)
Bilirubin Total: 0.2 mg/dL (ref 0.0–1.2)
CO2: 20 mmol/L (ref 20–29)
Calcium: 9.8 mg/dL (ref 8.7–10.2)
Chloride: 106 mmol/L (ref 96–106)
Creatinine, Ser: 1.39 mg/dL — ABNORMAL HIGH (ref 0.76–1.27)
Globulin, Total: 2.1 g/dL (ref 1.5–4.5)
Glucose: 133 mg/dL — ABNORMAL HIGH (ref 65–99)
Potassium: 4.2 mmol/L (ref 3.5–5.2)
Sodium: 141 mmol/L (ref 134–144)
Total Protein: 6.6 g/dL (ref 6.0–8.5)
eGFR: 59 mL/min/{1.73_m2} — ABNORMAL LOW (ref >59)

## 2020-10-13 ENCOUNTER — Other Ambulatory Visit: Payer: Self-pay | Admitting: Family Medicine

## 2020-10-13 DIAGNOSIS — M5412 Radiculopathy, cervical region: Secondary | ICD-10-CM

## 2020-10-21 ENCOUNTER — Encounter: Payer: Self-pay | Admitting: Family Medicine

## 2020-10-22 ENCOUNTER — Other Ambulatory Visit: Payer: Self-pay | Admitting: Physician Assistant

## 2020-10-22 ENCOUNTER — Other Ambulatory Visit: Payer: Self-pay | Admitting: Family Medicine

## 2020-11-10 ENCOUNTER — Other Ambulatory Visit: Payer: Self-pay | Admitting: Family Medicine

## 2020-11-14 ENCOUNTER — Other Ambulatory Visit: Payer: Self-pay

## 2020-11-14 MED ORDER — EZETIMIBE 10 MG PO TABS: 10.0000 mg | ORAL_TABLET | Freq: Every day | ORAL | 3 refills | Status: DC

## 2020-11-14 MED ORDER — AMITRIPTYLINE HCL 25 MG PO TABS: 25.0000 mg | ORAL_TABLET | Freq: Every day | ORAL | 3 refills | Status: DC

## 2020-11-14 MED ORDER — BUPROPION HCL ER (XL) 150 MG PO TB24: 1.0000 | ORAL_TABLET | Freq: Every day | ORAL | 3 refills | Status: DC

## 2020-11-17 ENCOUNTER — Other Ambulatory Visit: Payer: Self-pay | Admitting: Family Medicine

## 2020-11-17 MED ORDER — VENLAFAXINE HCL ER 150 MG PO CP24: 300.0000 mg | ORAL_CAPSULE | Freq: Every day | ORAL | 1 refills | Status: DC

## 2020-11-18 ENCOUNTER — Other Ambulatory Visit: Payer: Self-pay

## 2020-11-18 ENCOUNTER — Other Ambulatory Visit: Payer: Self-pay | Admitting: Physician Assistant

## 2020-11-18 ENCOUNTER — Other Ambulatory Visit: Payer: Self-pay | Admitting: Family Medicine

## 2020-11-18 MED ORDER — AMLODIPINE BESYLATE 10 MG PO TABS: 1.0000 | ORAL_TABLET | Freq: Every day | ORAL | 0 refills | Status: AC

## 2020-11-19 ENCOUNTER — Other Ambulatory Visit: Payer: Self-pay

## 2020-11-19 MED ORDER — TRAZODONE HCL 50 MG PO TABS: ORAL_TABLET | ORAL | 0 refills | Status: DC

## 2020-11-19 MED ORDER — LEVOTHYROXINE SODIUM 50 MCG PO TABS: 50.0000 ug | ORAL_TABLET | Freq: Every day | ORAL | 1 refills | Status: DC

## 2020-11-19 MED ORDER — FENOFIBRATE 145 MG PO TABS: 145.0000 mg | ORAL_TABLET | Freq: Every day | ORAL | 1 refills | Status: DC

## 2020-11-19 NOTE — Telephone Encounter (Signed)
Pt is switching pharmacies.   Frederick Martinez 11/19/20 7:48 AM

## 2020-11-27 ENCOUNTER — Other Ambulatory Visit: Payer: Self-pay

## 2020-11-27 MED ORDER — ESZOPICLONE 3 MG PO TABS: 3.0000 mg | ORAL_TABLET | Freq: Every day | ORAL | 1 refills | Status: DC

## 2020-11-27 MED ORDER — ICOSAPENT ETHYL 1 G PO CAPS: ORAL_CAPSULE | ORAL | 3 refills | Status: DC

## 2020-11-27 MED ORDER — VENLAFAXINE HCL ER 150 MG PO CP24: 300.0000 mg | ORAL_CAPSULE | Freq: Every day | ORAL | 1 refills | Status: DC

## 2020-12-03 ENCOUNTER — Other Ambulatory Visit: Payer: Self-pay | Admitting: Physician Assistant

## 2020-12-03 DIAGNOSIS — E782 Mixed hyperlipidemia: Secondary | ICD-10-CM

## 2020-12-04 MED ORDER — METFORMIN HCL 500 MG PO TABS: 1.0000 | ORAL_TABLET | Freq: Two times a day (BID) | ORAL | 3 refills | Status: DC

## 2020-12-04 MED ORDER — ATORVASTATIN CALCIUM 10 MG PO TABS: 1.0000 | ORAL_TABLET | Freq: Every day | ORAL | 3 refills | Status: DC

## 2020-12-04 NOTE — Addendum Note (Signed)
Addended by: Alonna Minium on: 12/04/2020 09:48 AM   Modules accepted: Orders

## 2020-12-04 NOTE — Telephone Encounter (Signed)
Needs change in pharmacy.   Harrell Lark 12/04/20 9:47 AM

## 2020-12-05 ENCOUNTER — Ambulatory Visit: Payer: Managed Care, Other (non HMO) | Admitting: Family Medicine

## 2020-12-15 ENCOUNTER — Other Ambulatory Visit: Payer: Self-pay | Admitting: Family Medicine

## 2020-12-15 NOTE — Progress Notes (Signed)
error 

## 2020-12-24 ENCOUNTER — Other Ambulatory Visit: Payer: Self-pay | Admitting: Family Medicine

## 2020-12-26 ENCOUNTER — Ambulatory Visit (INDEPENDENT_AMBULATORY_CARE_PROVIDER_SITE_OTHER): Payer: 59 | Admitting: Family Medicine

## 2020-12-26 ENCOUNTER — Encounter: Payer: Self-pay | Admitting: Family Medicine

## 2020-12-26 ENCOUNTER — Other Ambulatory Visit: Payer: Self-pay

## 2020-12-26 VITALS — BP 124/78 | HR 70 | Temp 97.6°F | Resp 16 | Wt 262.6 lb

## 2020-12-26 DIAGNOSIS — D649 Anemia, unspecified: Secondary | ICD-10-CM

## 2020-12-26 DIAGNOSIS — E1142 Type 2 diabetes mellitus with diabetic polyneuropathy: Secondary | ICD-10-CM

## 2020-12-26 DIAGNOSIS — I129 Hypertensive chronic kidney disease with stage 1 through stage 4 chronic kidney disease, or unspecified chronic kidney disease: Secondary | ICD-10-CM

## 2020-12-26 DIAGNOSIS — K219 Gastro-esophageal reflux disease without esophagitis: Secondary | ICD-10-CM

## 2020-12-26 DIAGNOSIS — E782 Mixed hyperlipidemia: Secondary | ICD-10-CM | POA: Diagnosis not present

## 2020-12-26 DIAGNOSIS — E039 Hypothyroidism, unspecified: Secondary | ICD-10-CM | POA: Diagnosis not present

## 2020-12-26 DIAGNOSIS — N1831 Chronic kidney disease, stage 3a: Secondary | ICD-10-CM

## 2020-12-26 DIAGNOSIS — I10 Essential (primary) hypertension: Secondary | ICD-10-CM

## 2020-12-26 NOTE — Progress Notes (Signed)
Subjective:  Patient ID: Frederick Martinez, male    DOB: Jan 25, 1961  Age: 60 y.o. MRN: [id number]  Chief Complaint  Patient presents with   Hypertension   Hyperlipidemia   Diabetes    HPI Pt feels well overall. Better than he has in a good while.  Hypertension, essential Taking lisinopril 20 mg once daily, metoprolol xl 25 mg once daily, and amlodipine 10 mg daily   Type 2 diabetes mellitus with polyneuropathy (HCC) Checking sugars occasionally. 100-150 when he checks it. No low sugars. Checking feet some. Annual eye exam daily. Metformin 500 mg one twice a day and on lyrica..   Mixed hyperlipidemia: On lipitor 10 mg once daily, zetia 10 mg once daily, tricor, vascepa Does not eat healthy. Very active at work.    Acquired hypothyroidism Taking synthroid 50 mcg once daily   Gastroesophageal reflux disease without esophagitis: At his last visit we changed his nexium to 40 mg twice daily due to having breakthrough reflux causing him to vomit at night. Reflux improved.   IBS-D: Takes linzess  every other day.   Depression: on effexor xr 150 mg 2 daily and trazodone 50 mg once daily.   Current Outpatient Medications on File Prior to Visit  Medication Sig Dispense Refill   amitriptyline (ELAVIL) 25 MG tablet Take 1 tablet (25 mg total) by mouth at bedtime. 90 tablet 3   amLODipine (NORVASC) 10 MG tablet Take 1 tablet (10 mg total) by mouth daily. 90 tablet 0   Ascorbic Acid (VITAMIN C) 1000 MG tablet Take 1,000 mg by mouth daily.     atorvastatin (LIPITOR) 10 MG tablet Take 1 tablet (10 mg total) by mouth daily. 90 tablet 3   buPROPion (WELLBUTRIN XL) 150 MG 24 hr tablet Take 1 tablet (150 mg total) by mouth daily. 90 tablet 3   clonazePAM (KLONOPIN) 0.5 MG tablet TAKE 1 TABLET BY MOUTH 2 TIMES DAILY. 60 tablet 2   esomeprazole (NEXIUM) 40 MG capsule TAKE 1 CAPSULE BY MOUTH 2 TIMES DAILY BEFORE A MEAL. 180 capsule 0   Eszopiclone 3 MG TABS Take 1 tablet (3 mg total) by mouth at bedtime.  Take immediately before bedtime 90 tablet 1   ezetimibe (ZETIA) 10 MG tablet Take 1 tablet (10 mg total) by mouth daily. 90 tablet 3   famotidine (PEPCID) 40 MG tablet Take 40 mg by mouth 2 (two) times daily.     fenofibrate (TRICOR) 145 MG tablet Take 1 tablet (145 mg total) by mouth daily. 90 tablet 1   fluticasone (FLONASE) 50 MCG/ACT nasal spray Place 2 sprays into both nostrils daily. 16 g 6   furosemide (LASIX) 20 MG tablet TAKE 1 TABLET DAILY 90 tablet 3   HYDROcodone-acetaminophen (NORCO/VICODIN) 5-325 MG tablet TAKE 1 TABLET BY MOUTH EVERY 6 HOURS AS NEEDED FOR MODERATE PAIN. 40 tablet 0   icosapent Ethyl (VASCEPA) 1 g capsule TAKE 2 CAPSULES TWICE A DAY WITH FOOD SWALLOWING WHOLE, DO NOT CHEW, OPEN, DISSOLVE AND/OR CRUSH 360 capsule 3   levocetirizine (XYZAL) 5 MG tablet Take by mouth every evening.     levothyroxine (SYNTHROID) 50 MCG tablet Take 1 tablet (50 mcg total) by mouth daily. 90 tablet 1   LINZESS 290 MCG CAPS capsule TAKE 1 CAPSULE DAILY 30 MINUTES BEFORE THE FIRST MEAL OF THE DAY 90 capsule 3   lisinopril (ZESTRIL) 20 MG tablet TAKE 1 TABLET BY MOUTH DAILY. 90 tablet 0   meloxicam (MOBIC) 15 MG tablet TAKE 1 TABLET DAILY 90  tablet 3   metFORMIN (GLUCOPHAGE) 500 MG tablet Take 1 tablet (500 mg total) by mouth 2 (two) times daily. 180 tablet 3   methocarbamol (ROBAXIN) 750 MG tablet TAKE 1 TABLET BY MOUTH 4 TIMES DAILY. 120 tablet 0   metoprolol succinate (TOPROL-XL) 25 MG 24 hr tablet TAKE 1 TABLET DAILY 90 tablet 3   Multiple Vitamins-Minerals (CENTRUM SILVER 50+MEN PO) Take by mouth.     pregabalin (LYRICA) 300 MG capsule TAKE 1 CAPSULE BY MOUTH TWICE A DAY 180 capsule 0   promethazine (PHENERGAN) 25 MG tablet TAKE 1 TABLET BY MOUTH EVERY 6 HOURS AS NEEDED FOR NAUSEA AND VOMITING 60 tablet 0   traZODone (DESYREL) 50 MG tablet TAKE 1 TO 2 TABLETS AT BEDTIME 180 tablet 0   valACYclovir (VALTREX) 1000 MG tablet TAKE 1 TABLET BY MOUTH TWICE DAILY FOR 7 DAYS AS NEEDED FOR COLD  SORES. 14 tablet 2   venlafaxine XR (EFFEXOR-XR) 150 MG 24 hr capsule Take 2 capsules (300 mg total) by mouth daily with breakfast. 180 capsule 1   vitamin B-12 (CYANOCOBALAMIN) 250 MCG tablet Take 250 mcg by mouth daily.     No current facility-administered medications on file prior to visit.   Past Medical History:  Diagnosis Date   Acute coronary syndrome ([place]) 02/04/2020   Anxiety    Arthritis    Cancer ([place])    kidney cancer   Depression    Diabetes mellitus without complication ([place]) 8119   type II   Essential hypertension    GERD (gastroesophageal reflux disease)    History of kidney stones    Mixed hyperlipidemia    Past Surgical History:  Procedure Laterality Date   CHOLECYSTECTOMY  2008   PARTIAL NEPHRECTOMY  2002   kidney cancer   SHOULDER SURGERY  2008    Family History  Problem Relation Age of Onset   Drug abuse Sister    Aortic stenosis Brother        aortic valve replacement: postop infection   Cancer Brother    Heart disease Other    Stroke Other    Hypertension Other    Diabetes Other    CAD Other    Social History   Socioeconomic History   Marital status: Married    Spouse name: Not on file   Number of children: 2   Years of education: Not on file   Highest education level: Not on file  Occupational History   Occupation: maintance  Tobacco Use   Smoking status: Never   Smokeless tobacco: Never  Substance and Sexual Activity   Alcohol use: Yes    Comment: has not drank since 2011   Drug use: No   Sexual activity: Not on file  Other Topics Concern   Not on file  Social History Narrative   Not on file   Social Determinants of Health   Financial Resource Strain: Not on file  Food Insecurity: Not on file  Transportation Needs: Not on file  Physical Activity: Not on file  Stress: Not on file  Social Connections: Not on file    Review of Systems  Constitutional:  Negative for chills, fatigue and fever.  HENT:  Negative for  congestion, ear pain and sore throat.   Respiratory:  Negative for cough and shortness of breath.   Cardiovascular:  Negative for chest pain.  Gastrointestinal:  Negative for abdominal pain, constipation, diarrhea, nausea and vomiting.  Endocrine: Negative for polydipsia, polyphagia and polyuria.  Genitourinary:  Negative  for dysuria and frequency.  Musculoskeletal:  Negative for arthralgias and myalgias.  Neurological:  Negative for dizziness and headaches.  Psychiatric/Behavioral:  Negative for dysphoric mood.        No dysphoria    Objective:  BP 124/78   Pulse 70   Temp 97.6 F (36.4 C)   Resp 16   Wt 262 lb 9.6 oz (119.1 kg)   SpO2 95%   BMI 35.61 kg/m   BP/Weight 12/26/2020 08/29/2020 09/73/5329  Systolic BP 924 268 -  Diastolic BP 78 64 -  Wt. (Lbs) 262.6 268 261  BMI 35.61 36.35 35.4    Physical Exam Vitals reviewed.  Constitutional:      Appearance: Normal appearance.  Neck:     Vascular: No carotid bruit.  Cardiovascular:     Rate and Rhythm: Normal rate and regular rhythm.     Pulses: Normal pulses.     Heart sounds: Normal heart sounds.  Pulmonary:     Effort: Pulmonary effort is normal.     Breath sounds: Normal breath sounds. No wheezing, rhonchi or rales.  Abdominal:     General: Bowel sounds are normal.     Palpations: Abdomen is soft.     Tenderness: There is no abdominal tenderness.  Neurological:     Mental Status: He is alert.  Psychiatric:        Mood and Affect: Mood normal.        Behavior: Behavior normal.    Diabetic Foot Exam - Simple   Simple Foot Form Diabetic Foot exam was performed with the following findings: Yes 12/27/2020  7:16 PM  Visual Inspection No deformities, no ulcerations, no other skin breakdown bilaterally: Yes Sensation Testing Intact to touch and monofilament testing bilaterally: Yes Pulse Check Posterior Tibialis and Dorsalis pulse intact bilaterally: Yes Comments      Lab Results  Component Value Date    WBC 4.8 12/26/2020   HGB 11.6 (L) 12/26/2020   HCT 35.8 (L) 12/26/2020   PLT 273 12/26/2020   GLUCOSE 102 (H) 12/26/2020   CHOL 175 12/26/2020   TRIG 246 (H) 12/26/2020   HDL 43 12/26/2020   LDLCALC 91 12/26/2020   ALT 42 12/26/2020   AST 35 12/26/2020   NA 141 12/26/2020   K 4.8 12/26/2020   CL 103 12/26/2020   CREATININE 1.50 (H) 12/26/2020   BUN 20 12/26/2020   CO2 24 12/26/2020   TSH 1.370 12/26/2020   INR 1.04 03/28/2014   HGBA1C 6.1 (H) 12/26/2020   MICROALBUR 80 08/29/2020      Assessment & Plan:   1. Hypertensive kidney disease with stage 3a chronic kidney disease ([place]) Well controlled bp. Kidney function worsened.  Avoid all nsaids.  Continue to work on eating a healthy diet and exercise.  Labs drawn today.  - CBC with Differential/Platelet - Comprehensive metabolic panel  2. Type 2 diabetes mellitus with polyneuropathy (HCC) Control: good Pt does not need to check sugars daily. Recommend check feet daily. Recommend annual eye exams. Medicines: no change Continue to work on eating a healthy diet and exercise.  Labs drawn today.   - Hemoglobin A1c - POCT UA - Microalbumin  3. Mixed hyperlipidemia LDL at goal. Triglycerides too high.  No changes to medicines. On max meds. Continue to work on eating a healthy diet and exercise.  Labs drawn today.  - Lipid panel  4. Acquired hypothyroidism The current medical regimen is effective;  continue present plan and medications. - TSH  5.  Gastroesophageal reflux disease without esophagitis The current medical regimen is effective;  continue present plan and medications.  6. Severe obesity with body mass index (BMI) of 35.0 to 39.9 with serious comorbidity ([place])  Recommend continue to work on eating healthy diet and exercise.  7. Normocytic anemia Stable.  - cbc   Orders Placed This Encounter  Procedures   CBC with Differential/Platelet   Comprehensive metabolic panel   Hemoglobin A1c   Lipid panel    TSH   Cardiovascular Risk Assessment   POCT UA - Microalbumin     Follow-up: Return in about 3 months (around 03/28/2021) for fasting.  An After Visit Summary was printed and given to the patient.  Rochel Brome, MD [facility] [phone]

## 2020-12-27 ENCOUNTER — Encounter: Payer: Self-pay | Admitting: Family Medicine

## 2020-12-27 DIAGNOSIS — E039 Hypothyroidism, unspecified: Secondary | ICD-10-CM

## 2020-12-27 HISTORY — DX: Hypothyroidism, unspecified: E03.9

## 2020-12-27 LAB — CBC WITH DIFFERENTIAL/PLATELET
Basophils Absolute: 0 10*3/uL (ref 0.0–0.2)
Basos: 1 %
EOS (ABSOLUTE): 0.3 10*3/uL (ref 0.0–0.4)
Eos: 6 %
Hematocrit: 35.8 % — ABNORMAL LOW (ref 37.5–51.0)
Hemoglobin: 11.6 g/dL — ABNORMAL LOW (ref 13.0–17.7)
Immature Grans (Abs): 0 10*3/uL (ref 0.0–0.1)
Immature Granulocytes: 0 %
Lymphocytes Absolute: 1.2 10*3/uL (ref 0.7–3.1)
Lymphs: 25 %
MCH: 29.8 pg (ref 26.6–33.0)
MCHC: 32.4 g/dL (ref 31.5–35.7)
MCV: 92 fL (ref 79–97)
Monocytes Absolute: 0.4 10*3/uL (ref 0.1–0.9)
Monocytes: 8 %
Neutrophils Absolute: 2.9 10*3/uL (ref 1.4–7.0)
Neutrophils: 60 %
Platelets: 273 10*3/uL (ref 150–450)
RBC: 3.89 x10E6/uL — ABNORMAL LOW (ref 4.14–5.80)
RDW: 12.1 % (ref 11.6–15.4)
WBC: 4.8 10*3/uL (ref 3.4–10.8)

## 2020-12-27 LAB — COMPREHENSIVE METABOLIC PANEL
ALT: 42 IU/L (ref 0–44)
AST: 35 IU/L (ref 0–40)
Albumin/Globulin Ratio: 2.2 (ref 1.2–2.2)
Albumin: 4.9 g/dL (ref 3.8–4.9)
Alkaline Phosphatase: 37 IU/L — ABNORMAL LOW (ref 44–121)
BUN/Creatinine Ratio: 13 (ref 9–20)
BUN: 20 mg/dL (ref 6–24)
Bilirubin Total: 0.3 mg/dL (ref 0.0–1.2)
CO2: 24 mmol/L (ref 20–29)
Calcium: 9.8 mg/dL (ref 8.7–10.2)
Chloride: 103 mmol/L (ref 96–106)
Creatinine, Ser: 1.5 mg/dL — ABNORMAL HIGH (ref 0.76–1.27)
Globulin, Total: 2.2 g/dL (ref 1.5–4.5)
Glucose: 102 mg/dL — ABNORMAL HIGH (ref 65–99)
Potassium: 4.8 mmol/L (ref 3.5–5.2)
Sodium: 141 mmol/L (ref 134–144)
Total Protein: 7.1 g/dL (ref 6.0–8.5)
eGFR: 54 mL/min/{1.73_m2} — ABNORMAL LOW (ref >59)

## 2020-12-27 LAB — POCT UA - MICROALBUMIN: Microalbumin Ur, POC: 30 mg/L

## 2020-12-27 LAB — LIPID PANEL
Chol/HDL Ratio: 4.1 ratio (ref 0.0–5.0)
Cholesterol, Total: 175 mg/dL (ref 100–199)
HDL: 43 mg/dL (ref >39)
LDL Chol Calc (NIH): 91 mg/dL (ref 0–99)
Triglycerides: 246 mg/dL — ABNORMAL HIGH (ref 0–149)
VLDL Cholesterol Cal: 41 mg/dL — ABNORMAL HIGH (ref 5–40)

## 2020-12-27 LAB — CARDIOVASCULAR RISK ASSESSMENT

## 2020-12-27 LAB — HEMOGLOBIN A1C
Est. average glucose Bld gHb Est-mCnc: 128 mg/dL
Hgb A1c MFr Bld: 6.1 % — ABNORMAL HIGH (ref 4.8–5.6)

## 2020-12-27 LAB — TSH: TSH: 1.37 u[IU]/mL (ref 0.450–4.500)

## 2020-12-29 ENCOUNTER — Ambulatory Visit (INDEPENDENT_AMBULATORY_CARE_PROVIDER_SITE_OTHER): Payer: 59

## 2020-12-29 DIAGNOSIS — Z23 Encounter for immunization: Secondary | ICD-10-CM

## 2020-12-29 NOTE — Progress Notes (Signed)
   Covid-19 Vaccination Clinic  Name:  Frederick Martinez    MRN: [id number] DOB: [birth year]  12/29/2020  [name] was observed post Covid-19 immunization for 15 minutes without incident. He was provided with Vaccine Information Sheet and instruction to access the V-Safe system.   [name] was instructed to call 911 with any severe reactions post vaccine: Difficulty breathing  Swelling of face and throat  A fast heartbeat  A bad rash all over body  Dizziness and weakness   Immunizations Administered     Name Date Dose VIS Date Route   PFIZER Comrnaty([place] TOP) Covid-19 Vaccine 12/29/2020  8:49 AM 0.3 mL 05/22/2020 Intramuscular   Manufacturer: [employer]   Lot: [id number]   [place]: [phone]

## 2020-12-30 ENCOUNTER — Encounter: Payer: Self-pay | Admitting: Family Medicine

## 2021-01-07 ENCOUNTER — Other Ambulatory Visit: Payer: Self-pay

## 2021-01-07 MED ORDER — HYDROCODONE-ACETAMINOPHEN 5-325 MG PO TABS: 1.0000 | ORAL_TABLET | Freq: Four times a day (QID) | ORAL | 0 refills | Status: DC | PRN

## 2021-01-14 ENCOUNTER — Other Ambulatory Visit: Payer: Self-pay | Admitting: Family Medicine

## 2021-01-19 ENCOUNTER — Other Ambulatory Visit: Payer: Self-pay | Admitting: Family Medicine

## 2021-01-22 ENCOUNTER — Other Ambulatory Visit: Payer: Self-pay

## 2021-01-22 DIAGNOSIS — M5412 Radiculopathy, cervical region: Secondary | ICD-10-CM

## 2021-01-22 MED ORDER — METHOCARBAMOL 750 MG PO TABS: 750.0000 mg | ORAL_TABLET | Freq: Four times a day (QID) | ORAL | 2 refills | Status: AC

## 2021-01-22 MED ORDER — MELOXICAM 15 MG PO TABS: 15.0000 mg | ORAL_TABLET | Freq: Every day | ORAL | 3 refills | Status: DC

## 2021-01-27 ENCOUNTER — Other Ambulatory Visit: Payer: Self-pay | Admitting: Family Medicine

## 2021-02-12 ENCOUNTER — Other Ambulatory Visit: Payer: Self-pay | Admitting: Family Medicine

## 2021-02-24 ENCOUNTER — Other Ambulatory Visit: Payer: Self-pay | Admitting: Family Medicine

## 2021-03-11 ENCOUNTER — Other Ambulatory Visit: Payer: Self-pay

## 2021-03-11 MED ORDER — LINACLOTIDE 290 MCG PO CAPS: ORAL_CAPSULE | ORAL | 1 refills | Status: DC

## 2021-04-03 ENCOUNTER — Ambulatory Visit (INDEPENDENT_AMBULATORY_CARE_PROVIDER_SITE_OTHER): Payer: 59 | Admitting: Family Medicine

## 2021-04-03 ENCOUNTER — Other Ambulatory Visit: Payer: Self-pay | Admitting: Physician Assistant

## 2021-04-03 ENCOUNTER — Other Ambulatory Visit: Payer: Self-pay | Admitting: Family Medicine

## 2021-04-03 ENCOUNTER — Ambulatory Visit (INDEPENDENT_AMBULATORY_CARE_PROVIDER_SITE_OTHER): Payer: 59

## 2021-04-03 ENCOUNTER — Other Ambulatory Visit: Payer: Self-pay

## 2021-04-03 ENCOUNTER — Encounter: Payer: Self-pay | Admitting: Family Medicine

## 2021-04-03 VITALS — BP 110/62 | HR 64 | Temp 97.2°F | Resp 18 | Ht 72.0 in | Wt 264.4 lb

## 2021-04-03 DIAGNOSIS — K219 Gastro-esophageal reflux disease without esophagitis: Secondary | ICD-10-CM | POA: Diagnosis not present

## 2021-04-03 DIAGNOSIS — F330 Major depressive disorder, recurrent, mild: Secondary | ICD-10-CM

## 2021-04-03 DIAGNOSIS — I152 Hypertension secondary to endocrine disorders: Secondary | ICD-10-CM

## 2021-04-03 DIAGNOSIS — N1831 Chronic kidney disease, stage 3a: Secondary | ICD-10-CM

## 2021-04-03 DIAGNOSIS — Z23 Encounter for immunization: Secondary | ICD-10-CM

## 2021-04-03 DIAGNOSIS — I10 Essential (primary) hypertension: Secondary | ICD-10-CM

## 2021-04-03 DIAGNOSIS — E039 Hypothyroidism, unspecified: Secondary | ICD-10-CM

## 2021-04-03 DIAGNOSIS — E1142 Type 2 diabetes mellitus with diabetic polyneuropathy: Secondary | ICD-10-CM

## 2021-04-03 DIAGNOSIS — E1159 Type 2 diabetes mellitus with other circulatory complications: Secondary | ICD-10-CM

## 2021-04-03 DIAGNOSIS — E782 Mixed hyperlipidemia: Secondary | ICD-10-CM

## 2021-04-03 DIAGNOSIS — I129 Hypertensive chronic kidney disease with stage 1 through stage 4 chronic kidney disease, or unspecified chronic kidney disease: Secondary | ICD-10-CM | POA: Insufficient documentation

## 2021-04-03 HISTORY — DX: Hypertensive chronic kidney disease with stage 1 through stage 4 chronic kidney disease, or unspecified chronic kidney disease: I12.9

## 2021-04-03 HISTORY — DX: Major depressive disorder, recurrent, mild: F33.0

## 2021-04-03 NOTE — Assessment & Plan Note (Signed)
Well controlled.  Continue amlodipine 10 mg  once daily, lisinopril 20 mg once daily, metoprolol succ 25 mg once daily.

## 2021-04-03 NOTE — Assessment & Plan Note (Signed)
The current medical regimen is effective;  continue present plan and medications. Continue synthroid 50 mcg once daily in am. 

## 2021-04-03 NOTE — Assessment & Plan Note (Addendum)
The current medical regimen is effective;  continue present plan and medications. Continue lipitor 10 mg once daily, zetai 10 mg once daily. Fenofibrate 145 mg once daily, vascepa 1 gm 2 capsules twice daily. Recommend continue to work on eating healthy diet and exercise.

## 2021-04-03 NOTE — Assessment & Plan Note (Signed)
Well controlled.  Continue nexium 40 mg one twice a day.  Continue famotidine 40 mg one twice a day.

## 2021-04-03 NOTE — Assessment & Plan Note (Signed)
Recommend continue to work on eating healthy diet and exercise. Recommend start ozempic 0.25 mg once weekly x 4 weeks than increase to 0.5 mg weekly.

## 2021-04-03 NOTE — Progress Notes (Signed)
0  Subjective:  Patient ID: Frederick Martinez, male    DOB: [birth year]  Age: 60 y.o. MRN: [id number]  Chief Complaint  Patient presents with   Diabetes   Hypothyroidism    HPI Diabetes:  Complications: Neuropathy Glucose checking:maybe twice a month.  Glucose logs: 100-150 Hypoglycemia: none Most recent A1C: 6.1 Current medications: metformin 500 mg one twice a day, Lyrica 300 mg one twice day.  Last Eye Exam: 09/25/2020 Foot checks: yes  Hyperlipidemia: Current medications: lipitor 10 mg once daily, zetai 10 mg once daily. Fenofibrate 145 mg once daily, vascepa 1 gm 2 capsules twice daily. Not taking aspirin 81 once daily.   Hypertension: Complications: none. Current medications: amlodipine 10 mg  once daily, lisinopril 20 mg once daily, metoprolol succ 25 mg once daily.  Hypothyroidism: On synthroid 50 mg once daily.  Diet: not healthy Exercise: active job.   Depression: effexor xr 150 mg 2 daily and wellbutrin xl 150 mg once daily.   Trazodone 50 mg 1-2 daily at night.   Chronic back pain: Robaxin 750 mg up to tid, meloxicam 15 mg once daily. Hydrococodone takes sparinlgy.  Current Outpatient Medications on File Prior to Visit  Medication Sig Dispense Refill   amitriptyline (ELAVIL) 25 MG tablet Take 1 tablet (25 mg total) by mouth at bedtime. 90 tablet 3   amLODipine (NORVASC) 10 MG tablet Take 1 tablet (10 mg total) by mouth daily. 90 tablet 0   Ascorbic Acid (VITAMIN C) 1000 MG tablet Take 1,000 mg by mouth daily.     atorvastatin (LIPITOR) 10 MG tablet Take 1 tablet (10 mg total) by mouth daily. 90 tablet 3   buPROPion (WELLBUTRIN XL) 150 MG 24 hr tablet Take 1 tablet (150 mg total) by mouth daily. 90 tablet 3   clonazePAM (KLONOPIN) 0.5 MG tablet TAKE 1 TABLET BY MOUTH 2 TIMES DAILY. 60 tablet 2   esomeprazole (NEXIUM) 40 MG capsule TAKE 1 CAPSULE BY MOUTH 2 TIMES DAILY BEFORE A MEAL. 180 capsule 0   Eszopiclone 3 MG TABS Take 1 tablet (3 mg total) by mouth at bedtime.  Take immediately before bedtime 90 tablet 1   ezetimibe (ZETIA) 10 MG tablet Take 1 tablet (10 mg total) by mouth daily. 90 tablet 3   famotidine (PEPCID) 40 MG tablet TAKE 1 TABLET BY MOUTH 2 TIMES DAILY 180 tablet 1   fenofibrate (TRICOR) 145 MG tablet Take 1 tablet (145 mg total) by mouth daily. 90 tablet 1   fluticasone (FLONASE) 50 MCG/ACT nasal spray Place 2 sprays into both nostrils daily. 16 g 6   furosemide (LASIX) 20 MG tablet TAKE 1 TABLET DAILY 90 tablet 3   icosapent Ethyl (VASCEPA) 1 g capsule TAKE 2 CAPSULES TWICE A DAY WITH FOOD SWALLOWING WHOLE, DO NOT CHEW, OPEN, DISSOLVE AND/OR CRUSH 360 capsule 3   levocetirizine (XYZAL) 5 MG tablet Take by mouth every evening.     levothyroxine (SYNTHROID) 50 MCG tablet Take 1 tablet (50 mcg total) by mouth daily. 90 tablet 1   lisinopril (ZESTRIL) 20 MG tablet TAKE 1 TABLET BY MOUTH DAILY 90 tablet 0   metFORMIN (GLUCOPHAGE) 500 MG tablet Take 1 tablet (500 mg total) by mouth 2 (two) times daily. 180 tablet 3   methocarbamol (ROBAXIN) 750 MG tablet Take 1 tablet (750 mg total) by mouth 4 (four) times daily. 360 tablet 2   metoprolol succinate (TOPROL-XL) 25 MG 24 hr tablet TAKE 1 TABLET DAILY 90 tablet 3   Multiple Vitamins-Minerals (CENTRUM SILVER  50+MEN PO) Take by mouth.     pregabalin (LYRICA) 300 MG capsule TAKE 1 CAPSULE BY MOUTH TWICE A DAY 180 capsule 1   traZODone (DESYREL) 50 MG tablet TAKE 1 TO 2 TABLETS AT BEDTIME 180 tablet 0   valACYclovir (VALTREX) 1000 MG tablet TAKE 1 TABLET BY MOUTH TWICE DAILY FOR 7 DAYS AS NEEDED FOR COLD SORES 14 tablet 1   venlafaxine XR (EFFEXOR-XR) 150 MG 24 hr capsule Take 2 capsules (300 mg total) by mouth daily with breakfast. 180 capsule 1   vitamin B-12 (CYANOCOBALAMIN) 250 MCG tablet Take 250 mcg by mouth daily.     No current facility-administered medications on file prior to visit.   Past Medical History:  Diagnosis Date   Acute coronary syndrome ([place]) 02/04/2020   Anxiety    Arthritis     Cancer ([place])    kidney cancer   Depression    Diabetes mellitus without complication ([place]) 5277   type II   Essential hypertension    GERD (gastroesophageal reflux disease)    History of kidney stones    Mixed hyperlipidemia    Past Surgical History:  Procedure Laterality Date   CHOLECYSTECTOMY  2008   PARTIAL NEPHRECTOMY  2002   kidney cancer   SHOULDER SURGERY  2008    Family History  Problem Relation Age of Onset   Drug abuse Sister    Aortic stenosis Brother        aortic valve replacement: postop infection   Cancer Brother    Heart disease Other    Stroke Other    Hypertension Other    Diabetes Other    CAD Other    Social History   Socioeconomic History   Marital status: Married    Spouse name: Not on file   Number of children: 2   Years of education: Not on file   Highest education level: Not on file  Occupational History   Occupation: maintance  Tobacco Use   Smoking status: Never   Smokeless tobacco: Never  Substance and Sexual Activity   Alcohol use: Yes    Comment: has not drank since 2011   Drug use: No   Sexual activity: Not on file  Other Topics Concern   Not on file  Social History Narrative   Not on file   Social Determinants of Health   Financial Resource Strain: Not on file  Food Insecurity: Not on file  Transportation Needs: Not on file  Physical Activity: Not on file  Stress: Not on file  Social Connections: Not on file    Review of Systems  Constitutional:  Negative for chills and fever.  HENT:  Negative for congestion, rhinorrhea and sore throat.   Respiratory:  Negative for cough and shortness of breath.   Cardiovascular:  Negative for chest pain and palpitations.  Gastrointestinal:  Negative for abdominal pain, constipation, diarrhea, nausea and vomiting.  Genitourinary:  Negative for dysuria and urgency.  Musculoskeletal:  Positive for back pain. Negative for arthralgias and myalgias.  Neurological:  Negative for  dizziness and headaches.  Psychiatric/Behavioral:  Negative for dysphoric mood. The patient is not nervous/anxious.     Objective:  BP 110/62   Pulse 64   Temp (!) 97.2 F (36.2 C)   Resp 18   Ht 6' (1.829 m)   Wt 264 lb 6.4 oz (119.9 kg)   BMI 35.86 kg/m   BP/Weight 04/03/2021 12/26/2020 02/05/2352  Systolic BP 614 431 540  Diastolic BP 62 78  64  Wt. (Lbs) 264.4 262.6 268  BMI 35.86 35.61 36.35    Physical Exam Vitals reviewed.  Constitutional:      Appearance: Normal appearance. He is obese.  Neck:     Vascular: No carotid bruit.  Cardiovascular:     Rate and Rhythm: Normal rate and regular rhythm.     Pulses: Normal pulses.     Heart sounds: Normal heart sounds.  Pulmonary:     Effort: Pulmonary effort is normal.     Breath sounds: Normal breath sounds. No wheezing, rhonchi or rales.  Abdominal:     General: Bowel sounds are normal.     Palpations: Abdomen is soft.     Tenderness: There is no abdominal tenderness.  Neurological:     Mental Status: He is alert.  Psychiatric:        Mood and Affect: Mood normal.        Behavior: Behavior normal.    Diabetic Foot Exam - Simple   Simple Foot Form Diabetic Foot exam was performed with the following findings: Yes 04/03/2021  9:18 AM  Visual Inspection No deformities, no ulcerations, no other skin breakdown bilaterally: Yes Sensation Testing See comments: Yes Pulse Check Posterior Tibialis and Dorsalis pulse intact bilaterally: Yes Comments Decreased sensation of BL feet.       Lab Results  Component Value Date   WBC 4.8 12/26/2020   HGB 11.6 (L) 12/26/2020   HCT 35.8 (L) 12/26/2020   PLT 273 12/26/2020   GLUCOSE 102 (H) 12/26/2020   CHOL 175 12/26/2020   TRIG 246 (H) 12/26/2020   HDL 43 12/26/2020   LDLCALC 91 12/26/2020   ALT 42 12/26/2020   AST 35 12/26/2020   NA 141 12/26/2020   K 4.8 12/26/2020   CL 103 12/26/2020   CREATININE 1.50 (H) 12/26/2020   BUN 20 12/26/2020   CO2 24 12/26/2020    TSH 1.370 12/26/2020   INR 1.04 03/28/2014   HGBA1C 6.1 (H) 12/26/2020   MICROALBUR 30 12/27/2020      Assessment & Plan:   Problem List Items Addressed This Visit       Cardiovascular and Mediastinum   Hypertension associated with diabetes ([place]) - Primary    Well controlled.  Continue amlodipine 10 mg  once daily, lisinopril 20 mg once daily, metoprolol succ 25 mg once daily.        Digestive   GERD (gastroesophageal reflux disease)    Well controlled.  Continue nexium 40 mg one twice a day.  Continue famotidine 40 mg one twice a day.         Endocrine   Type 2 diabetes mellitus with polyneuropathy (HCC)    Well controlled. Continue metformin 500 mg one twice a day.  Start ozempic for weight loss.       Relevant Orders   Hemoglobin A1c   Acquired hypothyroidism    The current medical regimen is effective;  continue present plan and medications. Continue synthroid 50 mcg once daily in am.         Genitourinary   Stage 3a chronic kidney disease (HCC)    Discontinue nsaids. Stop meloxicam.  Repeat cmp in 1 week.        Other   Mixed hyperlipidemia    The current medical regimen is effective;  continue present plan and medications. Continue lipitor 10 mg once daily, zetai 10 mg once daily. Fenofibrate 145 mg once daily, vascepa 1 gm 2 capsules twice daily. Recommend continue to  work on eating healthy diet and exercise.       Relevant Orders   Lipid panel   Mild recurrent major depression ([place])    The current medical regimen is effective;  continue present plan and medications. Continue effexor xr 150 mg 2 daily in am.  Continue wellbutrin xl 150 mg once in am.  Continue trazodone 50 mg one - two prn insomnia. Continue lunesta 3 mg once at night.       Severe obesity with body mass index (BMI) of 35.0 to 39.9 with serious comorbidity ([place])    Recommend continue to work on eating healthy diet and exercise. Recommend start ozempic 0.25 mg once weekly x 4  weeks than increase to 0.5 mg weekly.      Other Visit Diagnoses     Need for influenza vaccination       Relevant Orders   Flu Vaccine MDCK QUAD PF (Completed)     .  No orders of the defined types were placed in this encounter.   Orders Placed This Encounter  Procedures   Flu Vaccine MDCK QUAD PF   CBC with Differential/Platelet   Comprehensive metabolic panel   Hemoglobin A1c   Lipid panel     Follow-up: No follow-ups on file.  An After Visit Summary was printed and given to the patient.  Rochel Brome, MD [facility] [phone]

## 2021-04-03 NOTE — Patient Instructions (Addendum)
Start ozempic 0.25 mg once weekly for 4 weeks, than increase to 0.5 mg once weekly.  Call back to let me know you are tolerating it and I will send rx.

## 2021-04-03 NOTE — Assessment & Plan Note (Addendum)
Discontinue nsaids. Stop meloxicam.  Repeat cmp in 1 week.

## 2021-04-03 NOTE — Assessment & Plan Note (Signed)
Well controlled. Continue metformin 500 mg one twice a day.  Start ozempic for weight loss.

## 2021-04-03 NOTE — Assessment & Plan Note (Signed)
The current medical regimen is effective;  continue present plan and medications. Continue effexor xr 150 mg 2 daily in am.  Continue wellbutrin xl 150 mg once in am.  Continue trazodone 50 mg one - two prn insomnia. Continue lunesta 3 mg once at night.

## 2021-04-03 NOTE — Telephone Encounter (Signed)
I believe you just seen him today, not sure if you wanted to refill this.

## 2021-04-08 LAB — COMPREHENSIVE METABOLIC PANEL
ALT: 48 IU/L — ABNORMAL HIGH (ref 0–44)
AST: 42 IU/L — ABNORMAL HIGH (ref 0–40)
Albumin/Globulin Ratio: 2.4 — ABNORMAL HIGH (ref 1.2–2.2)
Albumin: 5 g/dL — ABNORMAL HIGH (ref 3.8–4.9)
Alkaline Phosphatase: 39 IU/L — ABNORMAL LOW (ref 44–121)
BUN/Creatinine Ratio: 14 (ref 9–20)
BUN: 21 mg/dL (ref 6–24)
Bilirubin Total: 0.3 mg/dL (ref 0.0–1.2)
CO2: 22 mmol/L (ref 20–29)
Calcium: 10.4 mg/dL — ABNORMAL HIGH (ref 8.7–10.2)
Chloride: 104 mmol/L (ref 96–106)
Creatinine, Ser: 1.53 mg/dL — ABNORMAL HIGH (ref 0.76–1.27)
Globulin, Total: 2.1 g/dL (ref 1.5–4.5)
Glucose: 104 mg/dL — ABNORMAL HIGH (ref 70–99)
Potassium: 5.1 mmol/L (ref 3.5–5.2)
Sodium: 140 mmol/L (ref 134–144)
Total Protein: 7.1 g/dL (ref 6.0–8.5)
eGFR: 52 mL/min/{1.73_m2} — ABNORMAL LOW (ref >59)

## 2021-04-08 LAB — CBC WITH DIFFERENTIAL/PLATELET
Basophils Absolute: 0.1 10*3/uL (ref 0.0–0.2)
Basos: 1 %
EOS (ABSOLUTE): 0.3 10*3/uL (ref 0.0–0.4)
Eos: 6 %
Hematocrit: 35.2 % — ABNORMAL LOW (ref 37.5–51.0)
Hemoglobin: 11.8 g/dL — ABNORMAL LOW (ref 13.0–17.7)
Immature Grans (Abs): 0 10*3/uL (ref 0.0–0.1)
Immature Granulocytes: 0 %
Lymphocytes Absolute: 1.5 10*3/uL (ref 0.7–3.1)
Lymphs: 28 %
MCH: 30.5 pg (ref 26.6–33.0)
MCHC: 33.5 g/dL (ref 31.5–35.7)
MCV: 91 fL (ref 79–97)
Monocytes Absolute: 0.5 10*3/uL (ref 0.1–0.9)
Monocytes: 9 %
Neutrophils Absolute: 2.9 10*3/uL (ref 1.4–7.0)
Neutrophils: 56 %
Platelets: 243 10*3/uL (ref 150–450)
RBC: 3.87 x10E6/uL — ABNORMAL LOW (ref 4.14–5.80)
RDW: 12 % (ref 11.6–15.4)
WBC: 5.2 10*3/uL (ref 3.4–10.8)

## 2021-04-08 LAB — HEMOGLOBIN A1C
Est. average glucose Bld gHb Est-mCnc: 120 mg/dL
Hgb A1c MFr Bld: 5.8 % — ABNORMAL HIGH (ref 4.8–5.6)

## 2021-04-08 LAB — CARDIOVASCULAR RISK ASSESSMENT

## 2021-04-08 LAB — LIPID PANEL
Chol/HDL Ratio: 3.7 ratio (ref 0.0–5.0)
Cholesterol, Total: 161 mg/dL (ref 100–199)
HDL: 43 mg/dL (ref >39)
LDL Chol Calc (NIH): 89 mg/dL (ref 0–99)
Triglycerides: 165 mg/dL — ABNORMAL HIGH (ref 0–149)
VLDL Cholesterol Cal: 29 mg/dL (ref 5–40)

## 2021-04-09 NOTE — Progress Notes (Signed)
Blood count: hb stable.  Liver function abnormal. Slightly up, but fairly stable.  Kidney function abnormal, but stable.  Cholesterol: Improved.  HBA1C: 5.8.

## 2021-04-22 ENCOUNTER — Other Ambulatory Visit: Payer: Self-pay

## 2021-04-22 MED ORDER — METOPROLOL SUCCINATE ER 25 MG PO TB24: 25.0000 mg | ORAL_TABLET | Freq: Every day | ORAL | 3 refills | Status: DC

## 2021-04-23 ENCOUNTER — Other Ambulatory Visit: Payer: Self-pay | Admitting: Family Medicine

## 2021-05-04 ENCOUNTER — Other Ambulatory Visit: Payer: Self-pay | Admitting: Family Medicine

## 2021-05-11 ENCOUNTER — Other Ambulatory Visit: Payer: Self-pay | Admitting: Family Medicine

## 2021-05-21 ENCOUNTER — Other Ambulatory Visit: Payer: Self-pay | Admitting: Family Medicine

## 2021-05-21 ENCOUNTER — Other Ambulatory Visit: Payer: Self-pay | Admitting: Legal Medicine

## 2021-05-27 ENCOUNTER — Other Ambulatory Visit: Payer: Self-pay | Admitting: Family Medicine

## 2021-05-27 ENCOUNTER — Other Ambulatory Visit: Payer: Self-pay | Admitting: Legal Medicine

## 2021-05-28 ENCOUNTER — Other Ambulatory Visit: Payer: Self-pay | Admitting: Family Medicine

## 2021-06-26 ENCOUNTER — Other Ambulatory Visit: Payer: Self-pay | Admitting: Family Medicine

## 2021-07-02 NOTE — Progress Notes (Signed)
Subjective:  Patient ID: Frederick Martinez, male    DOB: August 21, 1960  Age: 61 y.o. MRN: [id number]  Chief Complaint  Patient presents with   Diabetes   Gastroesophageal Reflux   Hypertension    HPI Diabetes: Metformin 500 mg BID. Last A1C: 5.8%. Does not check sugars. Checks feet daily.  Patient is eating low sugar diet. Does not avoid bread.   Hypertension: Currently taking amlodipine 10 mg daily, furosemide 20 mg daily, lisinopril 20 mg daily, metoprolol 25 mg daily.  Hyperlipidemia: She takes atorvastatin 10 mg daily, zetia 10 mg daily, fenofibrate 145 mg daily. Does ot eat low fat diet.   Pt has had LUQ abdominal pain under the ribs accompanied by burning and sour taste. On pepcid and nexium 40 mg one twice a day. Also adds omeprazole daily. Eats Tums all day. Avoids fast foods.    Current Outpatient Medications on File Prior to Visit  Medication Sig Dispense Refill   amitriptyline (ELAVIL) 25 MG tablet Take 1 tablet (25 mg total) by mouth at bedtime. 90 tablet 3   amLODipine (NORVASC) 10 MG tablet Take 1 tablet (10 mg total) by mouth daily. 90 tablet 0   Ascorbic Acid (VITAMIN C) 1000 MG tablet Take 1,000 mg by mouth daily.     atorvastatin (LIPITOR) 10 MG tablet Take 1 tablet (10 mg total) by mouth daily. 90 tablet 3   buPROPion (WELLBUTRIN XL) 150 MG 24 hr tablet Take 1 tablet (150 mg total) by mouth daily. 90 tablet 3   clonazePAM (KLONOPIN) 0.5 MG tablet TAKE 1 TABLET BY MOUTH 2 TIMES DAILY. 60 tablet 3   esomeprazole (NEXIUM) 40 MG capsule TAKE 1 CAPSULE BY MOUTH 2 TIMES DAILY BEFORE A MEAL. 180 capsule 0   Eszopiclone 3 MG TABS TAKE 1 TABLET BY MOUTH AT BEDTIME. TAKE IMMEDIATELY BEFORE BEDTIME 90 tablet 2   ezetimibe (ZETIA) 10 MG tablet Take 1 tablet (10 mg total) by mouth daily. 90 tablet 3   famotidine (PEPCID) 40 MG tablet TAKE 1 TABLET BY MOUTH 2 TIMES DAILY 180 tablet 1   fenofibrate (TRICOR) 145 MG tablet TAKE 1 TABLET BY MOUTH DAILY. 90 tablet 1   fluticasone (FLONASE)  50 MCG/ACT nasal spray Place 2 sprays into both nostrils daily. 16 g 6   furosemide (LASIX) 20 MG tablet TAKE 1 TABLET DAILY 90 tablet 3   HYDROcodone-acetaminophen (NORCO/VICODIN) 5-325 MG tablet TAKE 1 TABLET BY MOUTH EVERY 6 HOURS AS NEEDED FOR MODERATE PAIN. 40 tablet 0   icosapent Ethyl (VASCEPA) 1 g capsule TAKE 2 CAPSULES TWICE A DAY WITH FOOD SWALLOWING WHOLE, DO NOT CHEW, OPEN, DISSOLVE AND/OR CRUSH 360 capsule 3   levocetirizine (XYZAL) 5 MG tablet Take by mouth every evening.     levothyroxine (SYNTHROID) 50 MCG tablet TAKE 1 TABLET BY MOUTH DAILY. 90 tablet 1   lisinopril (ZESTRIL) 20 MG tablet TAKE 1 TABLET BY MOUTH DAILY 90 tablet 1   metFORMIN (GLUCOPHAGE) 500 MG tablet Take 1 tablet (500 mg total) by mouth 2 (two) times daily. 180 tablet 3   metoprolol succinate (TOPROL-XL) 25 MG 24 hr tablet Take 1 tablet (25 mg total) by mouth daily. 90 tablet 3   Multiple Vitamins-Minerals (CENTRUM SILVER 50+MEN PO) Take by mouth.     pregabalin (LYRICA) 300 MG capsule TAKE 1 CAPSULE BY MOUTH TWICE A DAY 180 capsule 1   promethazine (PHENERGAN) 25 MG tablet TAKE 1 TABLET BY MOUTH EVERY 6 HOURS AS NEEDED FOR NAUSEA AND VOMITING 60 tablet  0   traZODone (DESYREL) 50 MG tablet TAKE 1 TO 2 TABLETS AT BEDTIME 180 tablet 0   valACYclovir (VALTREX) 1000 MG tablet TAKE 1 TABLET BY MOUTH TWICE DAILY FOR 7 DAYS AS NEEDED FOR COLD SORES 14 tablet 0   venlafaxine XR (EFFEXOR-XR) 150 MG 24 hr capsule TAKE 2 CAPSULES BY MOUTH DAILY WITH BREAKFAST. 180 capsule 2   vitamin B-12 (CYANOCOBALAMIN) 250 MCG tablet Take 250 mcg by mouth daily.     No current facility-administered medications on file prior to visit.   Past Medical History:  Diagnosis Date   Acute coronary syndrome ([place]) 02/04/2020   Anxiety    Arthritis    Cancer ([place])    kidney cancer   Depression    Diabetes mellitus without complication ([place]) 5038   type II   Essential hypertension    GERD (gastroesophageal reflux disease)    History of  kidney stones    Mixed hyperlipidemia    Past Surgical History:  Procedure Laterality Date   CHOLECYSTECTOMY  2008   PARTIAL NEPHRECTOMY  2002   kidney cancer   SHOULDER SURGERY  2008    Family History  Problem Relation Age of Onset   Drug abuse Sister    Aortic stenosis Brother        aortic valve replacement: postop infection   Cancer Brother    Heart disease Other    Stroke Other    Hypertension Other    Diabetes Other    CAD Other    Social History   Socioeconomic History   Marital status: Married    Spouse name: Not on file   Number of children: 2   Years of education: Not on file   Highest education level: Not on file  Occupational History   Occupation: maintance  Tobacco Use   Smoking status: Never   Smokeless tobacco: Never  Substance and Sexual Activity   Alcohol use: Yes    Comment: has not drank since 2011   Drug use: No   Sexual activity: Not on file  Other Topics Concern   Not on file  Social History Narrative   Not on file   Social Determinants of Health   Financial Resource Strain: Not on file  Food Insecurity: Not on file  Transportation Needs: Not on file  Physical Activity: Not on file  Stress: Not on file  Social Connections: Not on file    Review of Systems  Constitutional:  Positive for fatigue. Negative for chills, fever and unexpected weight change.  HENT:  Negative for congestion, ear pain, sinus pain and sore throat.   Cardiovascular:  Negative for chest pain and palpitations.  Gastrointestinal:  Positive for abdominal pain. Negative for blood in stool, constipation, diarrhea, nausea and vomiting.  Endocrine: Negative for polydipsia.  Genitourinary:  Negative for dysuria.  Musculoskeletal:  Positive for back pain.  Skin:  Negative for rash.  Neurological:  Positive for headaches.  Psychiatric/Behavioral:  Negative for dysphoric mood. The patient is not nervous/anxious.     Objective:  BP 124/70    Pulse 78    Temp 97.6 F  (36.4 C)    Resp 16    Ht 6' (1.829 m)    Wt 263 lb (119.3 kg)    BMI 35.67 kg/m   BP/Weight 07/03/2021 04/03/2021 8/82/8003  Systolic BP 491 791 505  Diastolic BP 70 62 78  Wt. (Lbs) 263 264.4 262.6  BMI 35.67 35.86 35.61    Physical Exam Vitals reviewed.  Constitutional:      Appearance: Normal appearance.  Neck:     Vascular: No carotid bruit.  Cardiovascular:     Rate and Rhythm: Normal rate and regular rhythm.     Pulses: Normal pulses.     Heart sounds: Normal heart sounds.  Pulmonary:     Effort: Pulmonary effort is normal.     Breath sounds: Normal breath sounds. No wheezing, rhonchi or rales.  Abdominal:     General: Bowel sounds are normal.     Palpations: Abdomen is soft.     Tenderness: There is abdominal tenderness (LUQ, EPigastic tendrenss).  Neurological:     Mental Status: He is alert.  Psychiatric:        Mood and Affect: Mood normal.        Behavior: Behavior normal.   Diabetic Foot Exam - Simple   Simple Foot Form Diabetic Foot exam was performed with the following findings: Yes 07/03/2021  8:18 AM  Visual Inspection No deformities, no ulcerations, no other skin breakdown bilaterally: Yes Sensation Testing Intact to touch and monofilament testing bilaterally: Yes Pulse Check Posterior Tibialis and Dorsalis pulse intact bilaterally: Yes Comments      Lab Results  Component Value Date   WBC 4.4 07/03/2021   HGB 11.5 (L) 07/03/2021   HCT 33.9 (L) 07/03/2021   PLT 223 07/03/2021   GLUCOSE 104 (H) 07/03/2021   CHOL 151 07/03/2021   TRIG 224 (H) 07/03/2021   HDL 41 07/03/2021   LDLCALC 73 07/03/2021   ALT 57 (H) 07/03/2021   AST 52 (H) 07/03/2021   NA 141 07/03/2021   K 4.8 07/03/2021   CL 105 07/03/2021   CREATININE 1.60 (H) 07/03/2021   BUN 19 07/03/2021   CO2 24 07/03/2021   TSH 2.530 07/03/2021   INR 1.04 03/28/2014   HGBA1C 6.0 (H) 07/03/2021   MICROALBUR 30 12/27/2020      Assessment & Plan:   Problem List Items Addressed  This Visit       Cardiovascular and Mediastinum   Hypertension associated with diabetes ([place]) - Primary   Relevant Orders   Comprehensive metabolic panel (Completed)   CBC with Differential/Platelet (Completed)     Digestive   GERD (gastroesophageal reflux disease)   Relevant Medications   sucralfate (CARAFATE) 1 g tablet   Other Relevant Orders   Ambulatory referral to Gastroenterology     Endocrine   Type 2 diabetes mellitus with polyneuropathy (HCC)   Relevant Orders   Hemoglobin A1c (Completed)   Microalbumin / creatinine urine ratio (Completed)   Acquired hypothyroidism   Relevant Orders   TSH (Completed)     Genitourinary   Stage 3a chronic kidney disease ([place])     Other   Mixed hyperlipidemia   Relevant Orders   Lipid panel (Completed)   Mild recurrent major depression ([place])  .  Meds ordered this encounter  Medications   sucralfate (CARAFATE) 1 g tablet    Sig: Take 1 tablet (1 g total) by mouth 4 (four) times daily -  with meals and at bedtime.    Dispense:  120 tablet    Refill:  2    Orders Placed This Encounter  Procedures   Comprehensive metabolic panel   Hemoglobin A1c   Microalbumin / creatinine urine ratio   CBC with Differential/Platelet   TSH   Lipid panel   Cardiovascular Risk Assessment   Ambulatory referral to Gastroenterology     Follow-up: No follow-ups on file.  An  After Visit Summary was printed and given to the patient.  Rochel Brome, MD [facility] [phone]

## 2021-07-03 ENCOUNTER — Encounter: Payer: Self-pay | Admitting: Family Medicine

## 2021-07-03 ENCOUNTER — Other Ambulatory Visit: Payer: Self-pay

## 2021-07-03 ENCOUNTER — Ambulatory Visit: Payer: Managed Care, Other (non HMO) | Admitting: Family Medicine

## 2021-07-03 ENCOUNTER — Ambulatory Visit: Payer: 59 | Admitting: Family Medicine

## 2021-07-03 VITALS — BP 124/70 | HR 78 | Temp 97.6°F | Resp 16 | Ht 72.0 in | Wt 263.0 lb

## 2021-07-03 DIAGNOSIS — N1831 Chronic kidney disease, stage 3a: Secondary | ICD-10-CM

## 2021-07-03 DIAGNOSIS — E782 Mixed hyperlipidemia: Secondary | ICD-10-CM

## 2021-07-03 DIAGNOSIS — E1159 Type 2 diabetes mellitus with other circulatory complications: Secondary | ICD-10-CM

## 2021-07-03 DIAGNOSIS — K219 Gastro-esophageal reflux disease without esophagitis: Secondary | ICD-10-CM | POA: Diagnosis not present

## 2021-07-03 DIAGNOSIS — E1142 Type 2 diabetes mellitus with diabetic polyneuropathy: Secondary | ICD-10-CM | POA: Diagnosis not present

## 2021-07-03 DIAGNOSIS — I152 Hypertension secondary to endocrine disorders: Secondary | ICD-10-CM

## 2021-07-03 DIAGNOSIS — E039 Hypothyroidism, unspecified: Secondary | ICD-10-CM | POA: Diagnosis not present

## 2021-07-03 DIAGNOSIS — F330 Major depressive disorder, recurrent, mild: Secondary | ICD-10-CM

## 2021-07-03 MED ORDER — SUCRALFATE 1 G PO TABS: 1.0000 g | ORAL_TABLET | Freq: Three times a day (TID) | ORAL | 2 refills | Status: DC

## 2021-07-03 NOTE — Patient Instructions (Signed)
Add carafate for reflux

## 2021-07-04 LAB — COMPREHENSIVE METABOLIC PANEL
ALT: 57 IU/L — ABNORMAL HIGH (ref 0–44)
AST: 52 IU/L — ABNORMAL HIGH (ref 0–40)
Albumin/Globulin Ratio: 2.1 (ref 1.2–2.2)
Albumin: 4.8 g/dL (ref 3.8–4.9)
Alkaline Phosphatase: 38 IU/L — ABNORMAL LOW (ref 44–121)
BUN/Creatinine Ratio: 12 (ref 9–20)
BUN: 19 mg/dL (ref 6–24)
Bilirubin Total: 0.4 mg/dL (ref 0.0–1.2)
CO2: 24 mmol/L (ref 20–29)
Calcium: 10.3 mg/dL — ABNORMAL HIGH (ref 8.7–10.2)
Chloride: 105 mmol/L (ref 96–106)
Creatinine, Ser: 1.6 mg/dL — ABNORMAL HIGH (ref 0.76–1.27)
Globulin, Total: 2.3 g/dL (ref 1.5–4.5)
Glucose: 104 mg/dL — ABNORMAL HIGH (ref 70–99)
Potassium: 4.8 mmol/L (ref 3.5–5.2)
Sodium: 141 mmol/L (ref 134–144)
Total Protein: 7.1 g/dL (ref 6.0–8.5)
eGFR: 50 mL/min/{1.73_m2} — ABNORMAL LOW (ref >59)

## 2021-07-04 LAB — CBC WITH DIFFERENTIAL/PLATELET
Basophils Absolute: 0.1 10*3/uL (ref 0.0–0.2)
Basos: 1 %
EOS (ABSOLUTE): 0.3 10*3/uL (ref 0.0–0.4)
Eos: 7 %
Hematocrit: 33.9 % — ABNORMAL LOW (ref 37.5–51.0)
Hemoglobin: 11.5 g/dL — ABNORMAL LOW (ref 13.0–17.7)
Immature Grans (Abs): 0 10*3/uL (ref 0.0–0.1)
Immature Granulocytes: 0 %
Lymphocytes Absolute: 1.1 10*3/uL (ref 0.7–3.1)
Lymphs: 26 %
MCH: 30.9 pg (ref 26.6–33.0)
MCHC: 33.9 g/dL (ref 31.5–35.7)
MCV: 91 fL (ref 79–97)
Monocytes Absolute: 0.3 10*3/uL (ref 0.1–0.9)
Monocytes: 8 %
Neutrophils Absolute: 2.5 10*3/uL (ref 1.4–7.0)
Neutrophils: 58 %
Platelets: 223 10*3/uL (ref 150–450)
RBC: 3.72 x10E6/uL — ABNORMAL LOW (ref 4.14–5.80)
RDW: 12.3 % (ref 11.6–15.4)
WBC: 4.4 10*3/uL (ref 3.4–10.8)

## 2021-07-04 LAB — MICROALBUMIN / CREATININE URINE RATIO
Creatinine, Urine: 122.1 mg/dL
Microalb/Creat Ratio: 16 mg/g creat (ref 0–29)
Microalbumin, Urine: 19 ug/mL

## 2021-07-04 LAB — LIPID PANEL
Chol/HDL Ratio: 3.7 ratio (ref 0.0–5.0)
Cholesterol, Total: 151 mg/dL (ref 100–199)
HDL: 41 mg/dL (ref >39)
LDL Chol Calc (NIH): 73 mg/dL (ref 0–99)
Triglycerides: 224 mg/dL — ABNORMAL HIGH (ref 0–149)
VLDL Cholesterol Cal: 37 mg/dL (ref 5–40)

## 2021-07-04 LAB — HEMOGLOBIN A1C
Est. average glucose Bld gHb Est-mCnc: 126 mg/dL
Hgb A1c MFr Bld: 6 % — ABNORMAL HIGH (ref 4.8–5.6)

## 2021-07-04 LAB — TSH: TSH: 2.53 u[IU]/mL (ref 0.450–4.500)

## 2021-07-05 NOTE — Progress Notes (Signed)
Blood count abnormal, but anemia is stable.  Liver function abnormal. Slightly worse.  Kidney function abnormal. Continues to worsen. No nsaids. I would recommend kerendia 10 mg once daily. This has been shown to help improve kidney function.  Also Recommend renal ultrasounds and refer to nephrology.  Please check to see if he has seen nephrology before as I could not find documentation of this.  Not spilling protein in his urine which is good.   Thyroid function normal.  Cholesterol:Good except triglycerides much worse since 3 months ago. Please ask his wife if his medicines changed HBA1C: 6

## 2021-07-10 ENCOUNTER — Other Ambulatory Visit: Payer: Self-pay

## 2021-07-10 DIAGNOSIS — N1831 Chronic kidney disease, stage 3a: Secondary | ICD-10-CM

## 2021-07-10 MED ORDER — KERENDIA 10 MG PO TABS: 10.0000 mg | ORAL_TABLET | Freq: Every day | ORAL | 0 refills | Status: DC

## 2021-07-13 ENCOUNTER — Telehealth: Payer: Self-pay | Admitting: Family Medicine

## 2021-07-13 NOTE — Telephone Encounter (Signed)
° °  Frederick Martinez has been scheduled for the following appointment:  WHAT: RENAL ULTRASOUND WHERE: [facility] DATE: 07/15/21 TIME: 10:30 AM ARRIVAL TIME  Patient has been made aware.

## 2021-07-15 DIAGNOSIS — N1831 Chronic kidney disease, stage 3a: Secondary | ICD-10-CM | POA: Diagnosis not present

## 2021-07-15 DIAGNOSIS — N281 Cyst of kidney, acquired: Secondary | ICD-10-CM | POA: Diagnosis not present

## 2021-07-15 DIAGNOSIS — N200 Calculus of kidney: Secondary | ICD-10-CM | POA: Diagnosis not present

## 2021-07-15 NOTE — Assessment & Plan Note (Signed)
The current medical regimen is effective;  continue present plan and medications.  

## 2021-07-15 NOTE — Assessment & Plan Note (Signed)
Well controlled.  ?No changes to medicines.  ?Continue to work on eating a healthy diet and exercise.  ?Labs drawn today.  ?

## 2021-07-15 NOTE — Assessment & Plan Note (Signed)
Control: good Recommend check feet daily. Recommend annual eye exams. Medicines: metformin 500 mg twice daily  Continue to work on eating a healthy diet and exercise.  Labs drawn today.

## 2021-07-15 NOTE — Assessment & Plan Note (Signed)
Not at goal Continue nexium 40 mg twice daily and pepcid 40 mg twice daily.  Start on carafate 1 gm before meals and before bed  Refer to GI for probable EGD.

## 2021-07-15 NOTE — Assessment & Plan Note (Signed)
Check tsh. ?The current medical regimen is effective;  continue present plan and medications. ? ?

## 2021-07-15 NOTE — Assessment & Plan Note (Signed)
Stable

## 2021-07-21 ENCOUNTER — Other Ambulatory Visit: Payer: Self-pay | Admitting: Family Medicine

## 2021-07-22 NOTE — Telephone Encounter (Signed)
Patient received letter from insurance stating the Lyrica needs a different dx code to be covered. Associated code is M25.561 Pain in Right Knee.

## 2021-07-28 ENCOUNTER — Other Ambulatory Visit: Payer: Self-pay

## 2021-07-28 DIAGNOSIS — N1831 Chronic kidney disease, stage 3a: Secondary | ICD-10-CM

## 2021-08-14 ENCOUNTER — Telehealth: Payer: Self-pay

## 2021-08-14 DIAGNOSIS — Z87442 Personal history of urinary calculi: Secondary | ICD-10-CM | POA: Diagnosis not present

## 2021-08-14 DIAGNOSIS — K219 Gastro-esophageal reflux disease without esophagitis: Secondary | ICD-10-CM | POA: Diagnosis not present

## 2021-08-14 DIAGNOSIS — E782 Mixed hyperlipidemia: Secondary | ICD-10-CM | POA: Diagnosis not present

## 2021-08-14 DIAGNOSIS — C649 Malignant neoplasm of unspecified kidney, except renal pelvis: Secondary | ICD-10-CM | POA: Diagnosis not present

## 2021-08-14 DIAGNOSIS — E1122 Type 2 diabetes mellitus with diabetic chronic kidney disease: Secondary | ICD-10-CM | POA: Diagnosis not present

## 2021-08-14 DIAGNOSIS — N1831 Chronic kidney disease, stage 3a: Secondary | ICD-10-CM | POA: Diagnosis not present

## 2021-08-14 DIAGNOSIS — I129 Hypertensive chronic kidney disease with stage 1 through stage 4 chronic kidney disease, or unspecified chronic kidney disease: Secondary | ICD-10-CM | POA: Diagnosis not present

## 2021-08-14 DIAGNOSIS — N281 Cyst of kidney, acquired: Secondary | ICD-10-CM | POA: Diagnosis not present

## 2021-08-14 NOTE — Telephone Encounter (Signed)
Wife called stating patient was seen by Kidney doctor this morning who suggested that patient d/c meloxicam. She was requesting something a alternative medication. Per Dr. Tobie Poet patient can take voltaren gel or tylenol. ?

## 2021-08-19 ENCOUNTER — Other Ambulatory Visit: Payer: Self-pay | Admitting: Family Medicine

## 2021-08-26 ENCOUNTER — Other Ambulatory Visit: Payer: Self-pay | Admitting: Family Medicine

## 2021-09-17 ENCOUNTER — Other Ambulatory Visit: Payer: Self-pay | Admitting: Family Medicine

## 2021-09-22 ENCOUNTER — Other Ambulatory Visit: Payer: Self-pay | Admitting: Family Medicine

## 2021-10-01 NOTE — Progress Notes (Signed)
? ?Subjective:  ?Patient ID: Frederick Martinez, male    DOB: March 06, 1961  Age: 61 y.o. MRN: [id number] ? ?Chief Complaint  ?Patient presents with  ? Diabetes  ? Hypertension  ? Hyperlipidemia  ? ?HPI: ?Allergies: on zyrtec. Really flaring up. Xyzal did not help. Has not tried Human resources officer or claritin that he can remember.  ? ?Diabetes:  ?Complications: neuropathy ?Most recent A1C: 6.0. ?Current medications: metformin 500 mg twice daily, lyrica 300 mg twice daily.  ?Last Eye Exam: utd ?Foot checks: daily ?Not eating healthy. Very active job.  ? ?Hyperlipidemia: ?Current medications:red yeast rice, zetia, atorvastatin. Fenofibrate, omega 3 1000 mg 2 capsules twice daily.  ? ?Hypertension: ?Current medications: lisinopril 20 mg once daily, amlodipine 10 mg daily metoprolol xr 25 mg once daily.  ? ?Depression:  ?Wellbutrin xl 150 mg once daily in am, venlaaxine er 150 mg once daily. Amitriptyline, trazodone 50 mg 2 before bed, lunesta 3 mg before bed.  ? ?Hypothyroid: levothyroxine 50 mcgg once daily.  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  ? ?Current Outpatient Medications on File Prior to Visit  ?Medication Sig Dispense Refill  ? famotidine (PEPCID) 40 MG tablet TAKE 1 TABLET BY MOUTH 2 TIMES DAILY 180 tablet 0  ? pregabalin (LYRICA) 300 MG capsule TAKE 1 CAPSULE BY MOUTH TWICE A DAY 180 capsule 0  ? amitriptyline (ELAVIL) 25 MG tablet Take 1 tablet (25 mg total) by mouth at bedtime. 90 tablet 3  ? amLODipine (NORVASC) 10 MG tablet Take 1 tablet (10 mg total) by mouth daily. 90 tablet 0  ? Ascorbic Acid (VITAMIN C) 1000 MG tablet Take 1,000 mg by mouth daily.    ? atorvastatin (LIPITOR) 10 MG tablet Take 1 tablet (10 mg total) by mouth daily. 90 tablet 3  ? buPROPion (WELLBUTRIN XL) 150 MG 24 hr tablet Take 1 tablet (150 mg total) by mouth daily. 90 tablet 3  ? clonazePAM (KLONOPIN) 0.5 MG tablet TAKE 1 TABLET BY MOUTH 2 TIMES DAILY. 60 tablet 2  ? esomeprazole (NEXIUM) 40 MG capsule TAKE 1 CAPSULE BY MOUTH 2 TIMES DAILY BEFORE  A MEAL. 180 capsule 0  ? Eszopiclone 3 MG TABS TAKE 1 TABLET BY MOUTH AT BEDTIME. TAKE IMMEDIATELY BEFORE BEDTIME 90 tablet 2  ? ezetimibe (ZETIA) 10 MG tablet Take 1 tablet (10 mg total) by mouth daily. 90 tablet 3  ? fenofibrate (TRICOR) 145 MG tablet TAKE 1 TABLET BY MOUTH DAILY. 90 tablet 1  ? fluticasone (FLONASE) 50 MCG/ACT nasal spray Place 2 sprays into both nostrils daily. 16 g 6  ? furosemide (LASIX) 20 MG tablet TAKE 1 TABLET DAILY 90 tablet 3  ? HYDROcodone-acetaminophen (NORCO/VICODIN) 5-325 MG tablet TAKE 1 TABLET BY MOUTH EVERY 6 HOURS AS NEEDED FOR MODERATE PAIN. 40 tablet 0  ? icosapent Ethyl (VASCEPA) 1 g capsule TAKE 2 CAPSULES TWICE A DAY WITH FOOD SWALLOWING WHOLE, DO NOT CHEW, OPEN, DISSOLVE AND/OR CRUSH 360 capsule 3  ? levocetirizine (XYZAL) 5 MG tablet Take by mouth every evening.    ? levothyroxine (SYNTHROID) 50 MCG tablet TAKE 1 TABLET BY MOUTH DAILY. 90 tablet 1  ? lisinopril (ZESTRIL) 20 MG tablet TAKE 1 TABLET BY MOUTH DAILY 90 tablet 1  ? metFORMIN (GLUCOPHAGE) 500 MG tablet Take 1 tablet (500 mg total) by mouth 2 (two) times daily. 180 tablet 3  ? methocarbamol (ROBAXIN) 750 MG tablet Take 750 mg by mouth every 6 (six) hours as needed.    ? metoprolol succinate (TOPROL-XL) 25 MG 24 hr  tablet Take 1 tablet (25 mg total) by mouth daily. 90 tablet 3  ? Multiple Vitamins-Minerals (CENTRUM SILVER 50+MEN PO) Take by mouth.    ? promethazine (PHENERGAN) 25 MG tablet TAKE 1 TABLET BY MOUTH EVERY 6 HOURS AS NEEDED FOR NAUSEA AND VOMITING 60 tablet 0  ? sucralfate (CARAFATE) 1 g tablet Take 1 tablet (1 g total) by mouth 4 (four) times daily -  with meals and at bedtime. 120 tablet 2  ? traZODone (DESYREL) 50 MG tablet TAKE 1 TO 2 TABLETS AT BEDTIME 180 tablet 3  ? valACYclovir (VALTREX) 1000 MG tablet TAKE 1 TABLET BY MOUTH TWICE DAILY FOR 7 DAYS AS NEEDED FOR COLD SORES 14 tablet 0  ? venlafaxine XR (EFFEXOR-XR) 150 MG 24 hr capsule TAKE 2 CAPSULES BY MOUTH DAILY WITH BREAKFAST. 180 capsule 2   ? vitamin B-12 (CYANOCOBALAMIN) 250 MCG tablet Take 250 mcg by mouth daily.    ? ?No current facility-administered medications on file prior to visit.  ? ?Past Medical History:  ?Diagnosis Date  ? Acute coronary syndrome ([place]) 02/04/2020  ? Anxiety   ? Arthritis   ? Cancer [facility])   ? kidney cancer  ? Depression   ? Diabetes mellitus without complication ([place]) 1062  ? type II  ? Essential hypertension   ? GERD (gastroesophageal reflux disease)   ? History of kidney stones   ? Mixed hyperlipidemia   ? ?Past Surgical History:  ?Procedure Laterality Date  ? CHOLECYSTECTOMY  2008  ? PARTIAL NEPHRECTOMY  2002  ? kidney cancer  ? SHOULDER SURGERY  2008  ?  ?Family History  ?Problem Relation Age of Onset  ? Drug abuse Sister   ? Aortic stenosis Brother   ?     aortic valve replacement: postop infection  ? Cancer Brother   ? Heart disease Other   ? Stroke Other   ? Hypertension Other   ? Diabetes Other   ? CAD Other   ? ?Social History  ? ?Socioeconomic History  ? Marital status: Married  ?  Spouse name: Not on file  ? Number of children: 2  ? Years of education: Not on file  ? Highest education level: Not on file  ?Occupational History  ? Occupation: maintance  ?Tobacco Use  ? Smoking status: Never  ? Smokeless tobacco: Never  ?Substance and Sexual Activity  ? Alcohol use: Yes  ?  Comment: has not drank since 2011  ? Drug use: No  ? Sexual activity: Not on file  ?Other Topics Concern  ? Not on file  ?Social History Narrative  ? Not on file  ? ?Social Determinants of Health  ? ?Financial Resource Strain: Not on file  ?Food Insecurity: Not on file  ?Transportation Needs: Not on file  ?Physical Activity: Not on file  ?Stress: Not on file  ?Social Connections: Not on file  ? ? ?Review of Systems  ?Constitutional:  Negative for appetite change, fatigue and fever.  ?HENT:  Positive for congestion and sneezing. Negative for ear pain, sinus pressure and sore throat.   ?Respiratory:  Negative for cough, chest tightness, shortness of  breath and wheezing.   ?Cardiovascular:  Negative for chest pain and palpitations.  ?Gastrointestinal:  Negative for abdominal pain, constipation, diarrhea, nausea and vomiting.  ?Endocrine: Negative for polydipsia and polyphagia.  ?Genitourinary:  Negative for dysuria and hematuria.  ?Musculoskeletal:  Positive for arthralgias (bilateral knee pain) and back pain. Negative for joint swelling and myalgias.  ?Skin:  Negative for  rash.  ?Neurological:  Positive for headaches. Negative for dizziness and weakness.  ?Psychiatric/Behavioral:  Negative for dysphoric mood. The patient is not nervous/anxious.   ? ? ?Objective:  ?BP (!) 100/52   Pulse 78   Temp (!) 97.2 ?F (36.2 ?C)   Resp 18   Ht 6' (1.829 m)   Wt 261 lb (118.4 kg)   BMI 35.40 kg/m?  ? ? ?  10/02/2021  ?  7:36 AM 07/03/2021  ?  7:34 AM 04/03/2021  ?  8:45 AM  ?BP/Weight  ?Systolic BP 357 017 793  ?Diastolic BP 52 70 62  ?Wt. (Lbs) 261 263 264.4  ?BMI 35.4 kg/m2 35.67 kg/m2 35.86 kg/m2  ? ? ?Physical Exam ?Vitals reviewed.  ?Constitutional:   ?   Appearance: Normal appearance. He is normal weight.  ?HENT:  ?   Right Ear: Tympanic membrane, ear canal and external ear normal.  ?   Left Ear: Tympanic membrane, ear canal and external ear normal.  ?   Nose: Congestion and rhinorrhea present.  ?   Mouth/Throat:  ?   Mouth: Mucous membranes are moist.  ?   Pharynx: No oropharyngeal exudate or posterior oropharyngeal erythema.  ?Cardiovascular:  ?   Rate and Rhythm: Normal rate and regular rhythm.  ?   Heart sounds: Normal heart sounds.  ?Pulmonary:  ?   Effort: Pulmonary effort is normal. No respiratory distress.  ?   Breath sounds: Normal breath sounds. No wheezing, rhonchi or rales.  ?Abdominal:  ?   General: Bowel sounds are normal.  ?   Palpations: Abdomen is soft.  ?   Tenderness: There is no abdominal tenderness.  ?Lymphadenopathy:  ?   Cervical: No cervical adenopathy.  ?Neurological:  ?   Mental Status: He is alert and oriented to person, place, and time.   ?Psychiatric:     ?   Mood and Affect: Mood normal.     ?   Behavior: Behavior normal.  ? ? ?Diabetic Foot Exam - Simple   ?Simple Foot Form ? 10/02/2021 11:29 PM  ?Visual Inspection ?No deformities, no

## 2021-10-02 ENCOUNTER — Ambulatory Visit (INDEPENDENT_AMBULATORY_CARE_PROVIDER_SITE_OTHER): Payer: Managed Care, Other (non HMO) | Admitting: Family Medicine

## 2021-10-02 VITALS — BP 100/52 | HR 78 | Temp 97.2°F | Resp 18 | Ht 72.0 in | Wt 261.0 lb

## 2021-10-02 DIAGNOSIS — E1142 Type 2 diabetes mellitus with diabetic polyneuropathy: Secondary | ICD-10-CM

## 2021-10-02 DIAGNOSIS — E039 Hypothyroidism, unspecified: Secondary | ICD-10-CM

## 2021-10-02 DIAGNOSIS — J301 Allergic rhinitis due to pollen: Secondary | ICD-10-CM

## 2021-10-02 DIAGNOSIS — E1159 Type 2 diabetes mellitus with other circulatory complications: Secondary | ICD-10-CM | POA: Diagnosis not present

## 2021-10-02 DIAGNOSIS — K219 Gastro-esophageal reflux disease without esophagitis: Secondary | ICD-10-CM

## 2021-10-02 DIAGNOSIS — N1831 Chronic kidney disease, stage 3a: Secondary | ICD-10-CM | POA: Diagnosis not present

## 2021-10-02 DIAGNOSIS — R69 Illness, unspecified: Secondary | ICD-10-CM | POA: Diagnosis not present

## 2021-10-02 DIAGNOSIS — F330 Major depressive disorder, recurrent, mild: Secondary | ICD-10-CM

## 2021-10-02 DIAGNOSIS — I152 Hypertension secondary to endocrine disorders: Secondary | ICD-10-CM

## 2021-10-02 DIAGNOSIS — E782 Mixed hyperlipidemia: Secondary | ICD-10-CM | POA: Diagnosis not present

## 2021-10-02 MED ORDER — KERENDIA 10 MG PO TABS: 10.0000 mg | ORAL_TABLET | Freq: Every day | ORAL | 0 refills | Status: DC

## 2021-10-02 MED ORDER — TRIAMCINOLONE ACETONIDE 40 MG/ML IJ SUSP
80.0000 mg | Freq: Once | INTRAMUSCULAR | Status: AC
  Administered 2021-10-02: 80 mg via INTRAMUSCULAR

## 2021-10-02 MED ORDER — OLOPATADINE HCL 0.1 % OP SOLN: 1.0000 [drp] | Freq: Two times a day (BID) | OPHTHALMIC | 12 refills | Status: DC

## 2021-10-02 NOTE — Patient Instructions (Addendum)
Hypertension: Decrease amlodipine to 5 mg daily  ?Check bp daily.  ?Allergies: change zyrtec to allegra or claritin. Start patanol eye drops.  ?Kenalog 80 mg x one.  ? ? ?

## 2021-10-04 LAB — COMPREHENSIVE METABOLIC PANEL
ALT: 59 IU/L — ABNORMAL HIGH (ref 0–44)
AST: 46 IU/L — ABNORMAL HIGH (ref 0–40)
Albumin/Globulin Ratio: 2.4 — ABNORMAL HIGH (ref 1.2–2.2)
Albumin: 5 g/dL — ABNORMAL HIGH (ref 3.8–4.9)
Alkaline Phosphatase: 44 IU/L (ref 44–121)
BUN/Creatinine Ratio: 15 (ref 9–20)
BUN: 25 mg/dL — ABNORMAL HIGH (ref 6–24)
Bilirubin Total: 0.2 mg/dL (ref 0.0–1.2)
CO2: 24 mmol/L (ref 20–29)
Calcium: 10.9 mg/dL — ABNORMAL HIGH (ref 8.7–10.2)
Chloride: 102 mmol/L (ref 96–106)
Creatinine, Ser: 1.63 mg/dL — ABNORMAL HIGH (ref 0.76–1.27)
Globulin, Total: 2.1 g/dL (ref 1.5–4.5)
Glucose: 105 mg/dL — ABNORMAL HIGH (ref 70–99)
Potassium: 4.5 mmol/L (ref 3.5–5.2)
Sodium: 140 mmol/L (ref 134–144)
Total Protein: 7.1 g/dL (ref 6.0–8.5)
eGFR: 48 mL/min/{1.73_m2} — ABNORMAL LOW (ref >59)

## 2021-10-04 LAB — CBC WITH DIFF/PLATELET
Basophils Absolute: 0.1 10*3/uL (ref 0.0–0.2)
Basos: 1 %
EOS (ABSOLUTE): 0.3 10*3/uL (ref 0.0–0.4)
Eos: 6 %
Hematocrit: 36.3 % — ABNORMAL LOW (ref 37.5–51.0)
Hemoglobin: 12 g/dL — ABNORMAL LOW (ref 13.0–17.7)
Immature Grans (Abs): 0 10*3/uL (ref 0.0–0.1)
Immature Granulocytes: 0 %
Lymphocytes Absolute: 1.2 10*3/uL (ref 0.7–3.1)
Lymphs: 26 %
MCH: 30.5 pg (ref 26.6–33.0)
MCHC: 33.1 g/dL (ref 31.5–35.7)
MCV: 92 fL (ref 79–97)
Monocytes Absolute: 0.5 10*3/uL (ref 0.1–0.9)
Monocytes: 11 %
Neutrophils Absolute: 2.5 10*3/uL (ref 1.4–7.0)
Neutrophils: 56 %
Platelets: 264 10*3/uL (ref 150–450)
RBC: 3.93 x10E6/uL — ABNORMAL LOW (ref 4.14–5.80)
RDW: 12.2 % (ref 11.6–15.4)
WBC: 4.6 10*3/uL (ref 3.4–10.8)

## 2021-10-04 LAB — LIPID PANEL
Chol/HDL Ratio: 4.5 ratio (ref 0.0–5.0)
Cholesterol, Total: 165 mg/dL (ref 100–199)
HDL: 37 mg/dL — ABNORMAL LOW (ref >39)
LDL Chol Calc (NIH): 83 mg/dL (ref 0–99)
Triglycerides: 270 mg/dL — ABNORMAL HIGH (ref 0–149)
VLDL Cholesterol Cal: 45 mg/dL — ABNORMAL HIGH (ref 5–40)

## 2021-10-04 LAB — HEMOGLOBIN A1C
Est. average glucose Bld gHb Est-mCnc: 128 mg/dL
Hgb A1c MFr Bld: 6.1 % — ABNORMAL HIGH (ref 4.8–5.6)

## 2021-10-04 LAB — MICROALBUMIN / CREATININE URINE RATIO
Creatinine, Urine: 112.9 mg/dL
Microalb/Creat Ratio: 4 mg/g creat (ref 0–29)
Microalbumin, Urine: 4.4 ug/mL

## 2021-10-08 ENCOUNTER — Other Ambulatory Visit: Payer: Self-pay | Admitting: Family Medicine

## 2021-10-11 ENCOUNTER — Encounter: Payer: Self-pay | Admitting: Family Medicine

## 2021-10-11 DIAGNOSIS — J301 Allergic rhinitis due to pollen: Secondary | ICD-10-CM

## 2021-10-11 HISTORY — DX: Allergic rhinitis due to pollen: J30.1

## 2021-10-11 NOTE — Assessment & Plan Note (Signed)
Well controlled.  ?Stop red yeast rice. Continue zetia, atorvastatin. Fenofibrate, omega 3 1000 mg 2 capsules twice daily.  ?Continue to work on eating a healthy diet and exercise.  ?Labs drawn today.  ? ?

## 2021-10-11 NOTE — Assessment & Plan Note (Addendum)
The current medical regimen is effective;  continue present plan and medications. ?Continue lisinopril 20 mg once daily, metoprolol xr 25 mg once daily.  ? Decrease amlodipine to 5 mg daily  ?Check bp daily.  ? ?

## 2021-10-11 NOTE — Assessment & Plan Note (Signed)
Recommend continue to work on eating healthy diet and exercise.  

## 2021-10-11 NOTE — Assessment & Plan Note (Signed)
Stable. No nsaids.  ?

## 2021-10-11 NOTE — Assessment & Plan Note (Signed)
Allergies: change zyrtec to allegra or claritin. Start patanol eye drops.  ?Kenalog 80 mg x one.  ? ? ?

## 2021-10-11 NOTE — Assessment & Plan Note (Signed)
The current medical regimen is effective;  continue present plan and medications. Continue Wellbutrin xl 150 mg once daily in am, venlaaxine er 150 mg once daily. Amitriptyline, trazodone 50 mg 2 before bed, lunesta 3 mg before bed.  ?

## 2021-10-11 NOTE — Assessment & Plan Note (Signed)
Control: good ?Recommend check feet daily. ?Recommend annual eye exams. ?Medicines: no changes. Continue metformin and lyrica.  ?Continue to work on eating a healthy diet and exercise.  ?Labs drawn today.   ? ?

## 2021-10-12 ENCOUNTER — Telehealth: Payer: Self-pay

## 2021-10-12 NOTE — Telephone Encounter (Signed)
Patient's wife called and stated that they were told to cutt his amlodipine in half due to BP running low. She stated that they can't cutt the pill in half so she has been giving the full pill every other day. She states that his BP is still running low the highest being 110/66 and the lowest being 101/65. She is questioning if they need to discontinue the medicaiton? She is also inquiring about his lab results. I didn't see a note on them so I told her I would let you know. LA ?

## 2021-10-12 NOTE — Telephone Encounter (Signed)
Patient called. He VU of plan.  ? ?Harrell Lark 10/12/21 4:52 PM ? ?

## 2021-10-14 ENCOUNTER — Telehealth: Payer: Self-pay

## 2021-10-14 ENCOUNTER — Other Ambulatory Visit: Payer: Self-pay | Admitting: Family Medicine

## 2021-10-14 MED ORDER — KERENDIA 10 MG PO TABS: 1.0000 | ORAL_TABLET | Freq: Every day | ORAL | 1 refills | Status: DC

## 2021-10-14 NOTE — Telephone Encounter (Signed)
Patient's wife called and stated Carrington Clamp needs to be sent to [facility] on [address] please. ?

## 2021-10-22 ENCOUNTER — Other Ambulatory Visit: Payer: Self-pay | Admitting: Family Medicine

## 2021-11-04 ENCOUNTER — Other Ambulatory Visit: Payer: Self-pay | Admitting: Family Medicine

## 2021-11-12 ENCOUNTER — Other Ambulatory Visit: Payer: Self-pay | Admitting: Physician Assistant

## 2021-11-13 ENCOUNTER — Other Ambulatory Visit: Payer: Self-pay | Admitting: Family Medicine

## 2021-11-19 ENCOUNTER — Other Ambulatory Visit: Payer: Self-pay | Admitting: Family Medicine

## 2021-11-30 ENCOUNTER — Other Ambulatory Visit: Payer: Self-pay

## 2021-11-30 MED ORDER — ESZOPICLONE 3 MG PO TABS: 3.0000 mg | ORAL_TABLET | Freq: Every day | ORAL | 1 refills | Status: DC

## 2021-12-10 ENCOUNTER — Telehealth: Payer: Self-pay

## 2021-12-10 NOTE — Telephone Encounter (Signed)
Vascepa prior auth denied. States "The policy states that this medication may be approved if the member had an adverse reaction to the generic drug and the adverse reaction was due to an inactive ingredient. The request was denied because, based upon the information that is available to [place], you have not had an adverse reaction to an inactive ingredient that is in generic formulation. Formulary alternative(s) are icosapent ethyl; omega-3-acid ethyl esters capsule. Requirement: 3 in a class with 3 or more alternatives, 2 in a class with 2 alternatives, or 1 in a class with only 1 alternative."  Frederick Martinez, CCMA 12/10/21 8:28 AM

## 2021-12-14 ENCOUNTER — Other Ambulatory Visit: Payer: Self-pay

## 2021-12-14 MED ORDER — OMEGA-3-ACID ETHYL ESTERS 1 G PO CAPS: 1.0000 g | ORAL_CAPSULE | Freq: Two times a day (BID) | ORAL | 0 refills | Status: DC

## 2021-12-14 NOTE — Telephone Encounter (Signed)
Spoke with wife Santiago Glad and patient has taken lovaza without complication, RX sent in.

## 2021-12-18 ENCOUNTER — Other Ambulatory Visit: Payer: Self-pay | Admitting: Family Medicine

## 2021-12-18 ENCOUNTER — Other Ambulatory Visit: Payer: Self-pay | Admitting: Physician Assistant

## 2021-12-18 DIAGNOSIS — E782 Mixed hyperlipidemia: Secondary | ICD-10-CM

## 2021-12-21 ENCOUNTER — Telehealth: Payer: Self-pay

## 2021-12-21 NOTE — Telephone Encounter (Signed)
[employer] faxed the resolution for  Pregabalin 300 mg was approved for coverage since 12/21/2021 to 12/22/2022.

## 2022-01-08 ENCOUNTER — Ambulatory Visit: Payer: Managed Care, Other (non HMO) | Admitting: Family Medicine

## 2022-01-12 ENCOUNTER — Other Ambulatory Visit: Payer: Self-pay | Admitting: Family Medicine

## 2022-01-28 ENCOUNTER — Other Ambulatory Visit: Payer: Self-pay | Admitting: Legal Medicine

## 2022-01-29 ENCOUNTER — Ambulatory Visit: Payer: Managed Care, Other (non HMO) | Admitting: Family Medicine

## 2022-02-09 ENCOUNTER — Other Ambulatory Visit: Payer: Self-pay | Admitting: Legal Medicine

## 2022-02-09 ENCOUNTER — Other Ambulatory Visit: Payer: Self-pay | Admitting: Family Medicine

## 2022-02-17 ENCOUNTER — Ambulatory Visit: Payer: Managed Care, Other (non HMO)

## 2022-02-17 DIAGNOSIS — K219 Gastro-esophageal reflux disease without esophagitis: Secondary | ICD-10-CM | POA: Diagnosis not present

## 2022-02-17 DIAGNOSIS — Z87442 Personal history of urinary calculi: Secondary | ICD-10-CM | POA: Diagnosis not present

## 2022-02-17 DIAGNOSIS — C649 Malignant neoplasm of unspecified kidney, except renal pelvis: Secondary | ICD-10-CM | POA: Diagnosis not present

## 2022-02-17 DIAGNOSIS — E782 Mixed hyperlipidemia: Secondary | ICD-10-CM | POA: Diagnosis not present

## 2022-02-17 DIAGNOSIS — N281 Cyst of kidney, acquired: Secondary | ICD-10-CM | POA: Diagnosis not present

## 2022-02-17 DIAGNOSIS — E1122 Type 2 diabetes mellitus with diabetic chronic kidney disease: Secondary | ICD-10-CM | POA: Diagnosis not present

## 2022-02-17 DIAGNOSIS — N1831 Chronic kidney disease, stage 3a: Secondary | ICD-10-CM | POA: Diagnosis not present

## 2022-02-17 DIAGNOSIS — I129 Hypertensive chronic kidney disease with stage 1 through stage 4 chronic kidney disease, or unspecified chronic kidney disease: Secondary | ICD-10-CM | POA: Diagnosis not present

## 2022-02-17 LAB — HM DIABETES EYE EXAM

## 2022-02-21 NOTE — Progress Notes (Unsigned)
Subjective:  Patient ID: Frederick Martinez, male    DOB: 07/26/1960  Age: 61 y.o. MRN: [id number]  No chief complaint on file.   HPI Diabetes:  Complications: Glucose checking: Glucose logs: Hypoglycemia: Most recent A1C: 6.1% Current medications: Metformin 500 mg twice a day. Last Eye Exam: 09/25/2020 Foot checks:  CKF: Takes Finerenone 10 mg daily.  Depression: Effexor 150 mg daily, Trazodone 50 mg at bedtime, Amitriptyline 25 mg daily, Wellbutrin 150 mg daily.  Hyperlipidemia: Current medications: Fenofibrate 145 mg daily, Lovaza 1 g twice a day, Atorvastatin 10 mg daily, Zetia 10 mg daily.  Hypertension:  Current medications: Amlodipine 10 mg  daily, Lisinopril 20 mg daily, Metoprolol 25 mg daily twice a day.  GERD: Taking Esomeprazole 40 mg daily, Famotidine 40 mg daily.  Diet: Exercise:   Current Outpatient Medications on File Prior to Visit  Medication Sig Dispense Refill   amitriptyline (ELAVIL) 25 MG tablet TAKE 1 TABLET BY MOUTH AT BEDTIME. 90 tablet 2   amLODipine (NORVASC) 10 MG tablet Take 1 tablet (10 mg total) by mouth daily. 90 tablet 0   Ascorbic Acid (VITAMIN C) 1000 MG tablet Take 1,000 mg by mouth daily.     atorvastatin (LIPITOR) 10 MG tablet TAKE 1 TABLET BY MOUTH DAILY. 90 tablet 2   buPROPion (WELLBUTRIN XL) 150 MG 24 hr tablet TAKE 1 TABLET BY MOUTH DAILY 90 tablet 2   clonazePAM (KLONOPIN) 0.5 MG tablet TAKE 1 TABLET BY MOUTH 2 TIMES DAILY. 60 tablet 2   esomeprazole (NEXIUM) 40 MG capsule TAKE 1 CAPSULE BY MOUTH 2 TIMES DAILY BEFORE A MEAL. 180 capsule 1   Eszopiclone 3 MG TABS Take 1 tablet (3 mg total) by mouth at bedtime. Take immediately before bedtime 90 tablet 1   ezetimibe (ZETIA) 10 MG tablet TAKE 1 TABLET BY MOUTH DAILY 90 tablet 2   famotidine (PEPCID) 40 MG tablet TAKE 1 TABLET BY MOUTH TWO TIMES DAILY 180 tablet 0   fenofibrate (TRICOR) 145 MG tablet TAKE 1 TABLET BY MOUTH DAILY. 90 tablet 0   Finerenone (KERENDIA) 10 MG TABS Take 1 tablet  by mouth daily. 90 tablet 1   fluticasone (FLONASE) 50 MCG/ACT nasal spray Place 2 sprays into both nostrils daily. 16 g 6   furosemide (LASIX) 20 MG tablet TAKE 1 TABLET DAILY 90 tablet 3   HYDROcodone-acetaminophen (NORCO/VICODIN) 5-325 MG tablet TAKE 1 TABLET BY MOUTH EVERY 6 HOURS AS NEEDED FOR MODERATE PAIN. 40 tablet 0   levocetirizine (XYZAL) 5 MG tablet Take by mouth every evening.     levothyroxine (SYNTHROID) 50 MCG tablet TAKE 1 TABLET BY MOUTH DAILY. 90 tablet 0   lisinopril (ZESTRIL) 20 MG tablet TAKE 1 TABLET BY MOUTH DAILY 90 tablet 0   metFORMIN (GLUCOPHAGE) 500 MG tablet TAKE 1 TABLET BY MOUTH 2 TIMES DAILY. 180 tablet 2   methocarbamol (ROBAXIN) 750 MG tablet Take 750 mg by mouth every 6 (six) hours as needed.     metoprolol succinate (TOPROL-XL) 25 MG 24 hr tablet Take 1 tablet (25 mg total) by mouth daily. 90 tablet 3   Multiple Vitamins-Minerals (CENTRUM SILVER 50+MEN PO) Take by mouth.     olopatadine (PATANOL) 0.1 % ophthalmic solution Place 1 drop into both eyes 2 (two) times daily. 5 mL 12   omega-3 acid ethyl esters (LOVAZA) 1 g capsule Take 1 capsule (1 g total) by mouth 2 (two) times daily. 180 capsule 0   pregabalin (LYRICA) 300 MG capsule TAKE 1 CAPSULE BY  MOUTH TWICE A DAY 180 capsule 0   promethazine (PHENERGAN) 25 MG tablet TAKE 1 TABLET BY MOUTH EVERY 6 HOURS AS NEEDED FOR NAUSEA AND VOMITING 60 tablet 0   sucralfate (CARAFATE) 1 g tablet Take 1 tablet (1 g total) by mouth 4 (four) times daily -  with meals and at bedtime. 120 tablet 2   traZODone (DESYREL) 50 MG tablet TAKE 1 TO 2 TABLETS AT BEDTIME 180 tablet 3   valACYclovir (VALTREX) 1000 MG tablet TAKE 1 TABLET BY MOUTH TWICE DAILY FOR 7 DAYS AS NEEDED FOR COLD SORES 14 tablet 0   venlafaxine XR (EFFEXOR-XR) 150 MG 24 hr capsule TAKE 2 CAPSULES BY MOUTH DAILY WITH BREAKFAST. 180 capsule 1   vitamin B-12 (CYANOCOBALAMIN) 250 MCG tablet Take 250 mcg by mouth daily.     No current facility-administered  medications on file prior to visit.   Past Medical History:  Diagnosis Date   Acute coronary syndrome ([place]) 02/04/2020   Anxiety    Arthritis    Cancer ([place])    kidney cancer   Depression    Diabetes mellitus without complication ([place]) 1761   type II   Essential hypertension    GERD (gastroesophageal reflux disease)    History of kidney stones    Mixed hyperlipidemia    Past Surgical History:  Procedure Laterality Date   CHOLECYSTECTOMY  2008   PARTIAL NEPHRECTOMY  2002   kidney cancer   SHOULDER SURGERY  2008    Family History  Problem Relation Age of Onset   Drug abuse Sister    Aortic stenosis Brother        aortic valve replacement: postop infection   Cancer Brother    Heart disease Other    Stroke Other    Hypertension Other    Diabetes Other    CAD Other    Social History   Socioeconomic History   Marital status: Married    Spouse name: Not on file   Number of children: 2   Years of education: Not on file   Highest education level: Not on file  Occupational History   Occupation: maintance  Tobacco Use   Smoking status: Never   Smokeless tobacco: Never  Substance and Sexual Activity   Alcohol use: Yes    Comment: has not drank since 2011   Drug use: No   Sexual activity: Not on file  Other Topics Concern   Not on file  Social History Narrative   Not on file   Social Determinants of Health   Financial Resource Strain: Not on file  Food Insecurity: Not on file  Transportation Needs: Not on file  Physical Activity: Not on file  Stress: Not on file  Social Connections: Not on file    Review of Systems   Objective:  There were no vitals taken for this visit.     10/02/2021    7:36 AM 07/03/2021    7:34 AM 04/03/2021    8:45 AM  BP/Weight  Systolic BP 607 371 062  Diastolic BP 52 70 62  Wt. (Lbs) 261 263 264.4  BMI 35.4 kg/m2 35.67 kg/m2 35.86 kg/m2    Physical Exam  Diabetic Foot Exam - Simple   No data filed      Lab Results   Component Value Date   WBC 4.6 10/02/2021   HGB 12.0 (L) 10/02/2021   HCT 36.3 (L) 10/02/2021   PLT 264 10/02/2021   GLUCOSE 105 (H) 10/02/2021   CHOL  165 10/02/2021   TRIG 270 (H) 10/02/2021   HDL 37 (L) 10/02/2021   LDLCALC 83 10/02/2021   ALT 59 (H) 10/02/2021   AST 46 (H) 10/02/2021   NA 140 10/02/2021   K 4.5 10/02/2021   CL 102 10/02/2021   CREATININE 1.63 (H) 10/02/2021   BUN 25 (H) 10/02/2021   CO2 24 10/02/2021   TSH 2.530 07/03/2021   INR 1.04 03/28/2014   HGBA1C 6.1 (H) 10/02/2021   MICROALBUR 30 12/27/2020      Assessment & Plan:   Problem List Items Addressed This Visit       Cardiovascular and Mediastinum   Hypertension associated with diabetes ([place]) - Primary     Digestive   Gastroesophageal reflux disease without esophagitis     Endocrine   Type 2 diabetes mellitus with polyneuropathy (HCC)   Acquired hypothyroidism     Genitourinary   Stage 3a chronic kidney disease ([place])     Other   Mixed hyperlipidemia   Mild recurrent major depression ([place])  .  No orders of the defined types were placed in this encounter.   No orders of the defined types were placed in this encounter.    Follow-up: No follow-ups on file.  An After Visit Summary was printed and given to the patient.  Rochel Brome, MD [facility] [phone]

## 2022-02-22 ENCOUNTER — Ambulatory Visit: Payer: Managed Care, Other (non HMO) | Admitting: Family Medicine

## 2022-02-22 ENCOUNTER — Encounter: Payer: Self-pay | Admitting: Family Medicine

## 2022-02-22 VITALS — BP 108/60 | HR 72 | Temp 97.6°F | Resp 15 | Ht 72.0 in | Wt 264.0 lb

## 2022-02-22 DIAGNOSIS — I152 Hypertension secondary to endocrine disorders: Secondary | ICD-10-CM

## 2022-02-22 DIAGNOSIS — E1142 Type 2 diabetes mellitus with diabetic polyneuropathy: Secondary | ICD-10-CM | POA: Diagnosis not present

## 2022-02-22 DIAGNOSIS — Z23 Encounter for immunization: Secondary | ICD-10-CM

## 2022-02-22 DIAGNOSIS — E782 Mixed hyperlipidemia: Secondary | ICD-10-CM

## 2022-02-22 DIAGNOSIS — N1831 Chronic kidney disease, stage 3a: Secondary | ICD-10-CM | POA: Diagnosis not present

## 2022-02-22 DIAGNOSIS — E039 Hypothyroidism, unspecified: Secondary | ICD-10-CM | POA: Diagnosis not present

## 2022-02-22 DIAGNOSIS — E1159 Type 2 diabetes mellitus with other circulatory complications: Secondary | ICD-10-CM

## 2022-02-22 DIAGNOSIS — K219 Gastro-esophageal reflux disease without esophagitis: Secondary | ICD-10-CM

## 2022-02-22 DIAGNOSIS — R69 Illness, unspecified: Secondary | ICD-10-CM | POA: Diagnosis not present

## 2022-02-22 DIAGNOSIS — F330 Major depressive disorder, recurrent, mild: Secondary | ICD-10-CM

## 2022-02-22 NOTE — Assessment & Plan Note (Signed)
The current medical regimen is effective;  continue present plan and medications. Continue  Esomeprazole 40 mg daily, Famotidine 40 mg daily.

## 2022-02-22 NOTE — Assessment & Plan Note (Signed)
Control: good Recommend check sugars fasting daily. Recommend check feet daily. Recommend annual eye exams. Medicines: Continue Metformin 500 mg twice a day. Continue to work on eating a healthy diet and exercise.  Labs drawn today.    

## 2022-02-22 NOTE — Assessment & Plan Note (Signed)
Well controlled.  ?No changes to medicines.  ?Continue to work on eating a healthy diet and exercise.  ?Labs drawn today.  ?

## 2022-02-22 NOTE — Assessment & Plan Note (Signed)
Comorbidities: diabetes, hyperlipidemia.  Recommend continue to work on eating healthy diet and exercise.

## 2022-02-22 NOTE — Assessment & Plan Note (Signed)
Well controlled.  No changes to medicines. Continue Amlodipine 10 mg  daily, Lisinopril 20 mg daily, Metoprolol 25 mg daily twice a day. Continue to work on eating a healthy diet and exercise.  Labs drawn today.   

## 2022-02-22 NOTE — Assessment & Plan Note (Signed)
Previously well controlled Continue Synthroid at current dose  Recheck TSH and adjust Synthroid as indicated   

## 2022-02-22 NOTE — Assessment & Plan Note (Signed)
Stable. Management per specialist.   

## 2022-02-22 NOTE — Assessment & Plan Note (Signed)
The current medical regimen is effective;  continue present plan and medications.  

## 2022-02-23 ENCOUNTER — Other Ambulatory Visit: Payer: Self-pay | Admitting: Family Medicine

## 2022-02-23 ENCOUNTER — Encounter: Payer: Self-pay | Admitting: Family Medicine

## 2022-02-23 LAB — COMPREHENSIVE METABOLIC PANEL
ALT: 49 IU/L — ABNORMAL HIGH (ref 0–44)
AST: 44 IU/L — ABNORMAL HIGH (ref 0–40)
Albumin/Globulin Ratio: 2 (ref 1.2–2.2)
Albumin: 4.8 g/dL (ref 3.8–4.9)
Alkaline Phosphatase: 36 IU/L — ABNORMAL LOW (ref 44–121)
BUN/Creatinine Ratio: 11 (ref 9–20)
BUN: 14 mg/dL (ref 6–24)
Bilirubin Total: 0.3 mg/dL (ref 0.0–1.2)
CO2: 24 mmol/L (ref 20–29)
Calcium: 10.2 mg/dL (ref 8.7–10.2)
Chloride: 104 mmol/L (ref 96–106)
Creatinine, Ser: 1.27 mg/dL (ref 0.76–1.27)
Globulin, Total: 2.4 g/dL (ref 1.5–4.5)
Glucose: 107 mg/dL — ABNORMAL HIGH (ref 70–99)
Potassium: 4.8 mmol/L (ref 3.5–5.2)
Sodium: 142 mmol/L (ref 134–144)
Total Protein: 7.2 g/dL (ref 6.0–8.5)
eGFR: 65 mL/min/{1.73_m2} (ref >59)

## 2022-02-23 LAB — LIPID PANEL
Chol/HDL Ratio: 3.9 ratio (ref 0.0–5.0)
Cholesterol, Total: 174 mg/dL (ref 100–199)
HDL: 45 mg/dL (ref >39)
LDL Chol Calc (NIH): 79 mg/dL (ref 0–99)
Triglycerides: 306 mg/dL — ABNORMAL HIGH (ref 0–149)
VLDL Cholesterol Cal: 50 mg/dL — ABNORMAL HIGH (ref 5–40)

## 2022-02-23 LAB — CBC WITH DIFFERENTIAL/PLATELET
Basophils Absolute: 0 10*3/uL (ref 0.0–0.2)
Basos: 1 %
EOS (ABSOLUTE): 0.3 10*3/uL (ref 0.0–0.4)
Eos: 6 %
Hematocrit: 35.6 % — ABNORMAL LOW (ref 37.5–51.0)
Hemoglobin: 11.7 g/dL — ABNORMAL LOW (ref 13.0–17.7)
Immature Grans (Abs): 0 10*3/uL (ref 0.0–0.1)
Immature Granulocytes: 0 %
Lymphocytes Absolute: 1.1 10*3/uL (ref 0.7–3.1)
Lymphs: 23 %
MCH: 30.5 pg (ref 26.6–33.0)
MCHC: 32.9 g/dL (ref 31.5–35.7)
MCV: 93 fL (ref 79–97)
Monocytes Absolute: 0.4 10*3/uL (ref 0.1–0.9)
Monocytes: 8 %
Neutrophils Absolute: 3 10*3/uL (ref 1.4–7.0)
Neutrophils: 62 %
Platelets: 246 10*3/uL (ref 150–450)
RBC: 3.83 x10E6/uL — ABNORMAL LOW (ref 4.14–5.80)
RDW: 12.2 % (ref 11.6–15.4)
WBC: 4.9 10*3/uL (ref 3.4–10.8)

## 2022-02-23 LAB — HEMOGLOBIN A1C
Est. average glucose Bld gHb Est-mCnc: 131 mg/dL
Hgb A1c MFr Bld: 6.2 % — ABNORMAL HIGH (ref 4.8–5.6)

## 2022-02-23 LAB — CARDIOVASCULAR RISK ASSESSMENT

## 2022-02-23 LAB — TSH: TSH: 1.56 u[IU]/mL (ref 0.450–4.500)

## 2022-02-24 NOTE — Progress Notes (Signed)
Blood count shows anemia. Stable.   Liver function stable.  Kidney function normal.  Thyroid function normal.  Cholesterol: trigs high. Stricter diet recommended.  HBA1C: 6.2.

## 2022-03-09 ENCOUNTER — Other Ambulatory Visit: Payer: Self-pay | Admitting: Family Medicine

## 2022-04-07 ENCOUNTER — Other Ambulatory Visit: Payer: Self-pay | Admitting: Family Medicine

## 2022-04-07 ENCOUNTER — Other Ambulatory Visit: Payer: Self-pay | Admitting: Physician Assistant

## 2022-04-15 ENCOUNTER — Other Ambulatory Visit: Payer: Self-pay | Admitting: Family Medicine

## 2022-04-21 ENCOUNTER — Other Ambulatory Visit: Payer: Self-pay | Admitting: Legal Medicine

## 2022-05-05 ENCOUNTER — Other Ambulatory Visit: Payer: Self-pay | Admitting: Family Medicine

## 2022-05-20 ENCOUNTER — Other Ambulatory Visit: Payer: Self-pay | Admitting: Family Medicine

## 2022-05-21 ENCOUNTER — Other Ambulatory Visit: Payer: Self-pay | Admitting: Family Medicine

## 2022-06-01 ENCOUNTER — Other Ambulatory Visit: Payer: Self-pay | Admitting: Family Medicine

## 2022-06-02 ENCOUNTER — Other Ambulatory Visit: Payer: Self-pay | Admitting: Family Medicine

## 2022-07-01 ENCOUNTER — Other Ambulatory Visit: Payer: Self-pay | Admitting: Family Medicine

## 2022-07-01 NOTE — Progress Notes (Signed)
Subjective:  Patient ID: Frederick Martinez, male    DOB: 07/01/1960  Age: 62 y.o. MRN: [id number]  Chief Complaint  Patient presents with   Diabetes   Hypertension   Hyperlipidemia   HPI: Diabetes:  Complications: hyperlipidemia, hypertension Glucose checking: infrequent. Only when feels bad.  Most recent A1C: 6.2% Current medications: Metformin 500 mg twice a day. Last Eye Exam: 02/17/2022 Foot checks: daily  CKD 3A: Discontinued kerendia by nephrology. Discontinued meloxicam.  Depression: Effexor 150 mg daily, Trazodone 50 mg at bedtime, Amitriptyline 25 mg daily, Wellbutrin 150 mg daily. Doing well.    07/02/2022    7:38 AM 02/22/2022    8:09 AM 04/03/2021   11:01 AM  PHQ9 SCORE ONLY  PHQ-9 Total Score 0 1 0    Hyperlipidemia: Current medications: Fenofibrate 145 mg daily, Lovaza 1 g twice a day, Atorvastatin 10 mg daily, Zetia 10 mg daily. Trying to eat healthier. Active in his job.   Hypertension: Current medications: Amlodipine 10 mg  daily, Lisinopril 20 mg daily, Metoprolol 25 mg daily twice a day.  GERD: Taking Esomeprazole 40 mg twice daily, Famotidine 40 mg twice daily. Controlled.   Chronic pain syndrome: Hydrocodone/apap sparingly, but usually once at night. Takes tylenol if flares his back up. No nsaids.    Current Outpatient Medications on File Prior to Visit  Medication Sig Dispense Refill   amitriptyline (ELAVIL) 25 MG tablet TAKE 1 TABLET BY MOUTH AT BEDTIME. 90 tablet 2   amLODipine (NORVASC) 10 MG tablet Take 1 tablet (10 mg total) by mouth daily. 90 tablet 0   Ascorbic Acid (VITAMIN C) 1000 MG tablet Take 1,000 mg by mouth daily.     atorvastatin (LIPITOR) 10 MG tablet TAKE 1 TABLET BY MOUTH DAILY. 90 tablet 2   buPROPion (WELLBUTRIN XL) 150 MG 24 hr tablet TAKE 1 TABLET BY MOUTH DAILY 90 tablet 2   clonazePAM (KLONOPIN) 0.5 MG tablet TAKE 1 TABLET BY MOUTH 2 TIMES DAILY. 60 tablet 1   esomeprazole (NEXIUM) 40 MG capsule TAKE 1 CAPSULE BY MOUTH 2  TIMES DAILY BEFORE A MEAL. 180 capsule 1   Eszopiclone 3 MG TABS TAKE 1 TABLET BY MOUTH ONCE DAILY AT BEDTIME TAKE  IMMEDIATELY  BEFORE  BEDTIME 90 tablet 1   ezetimibe (ZETIA) 10 MG tablet TAKE 1 TABLET BY MOUTH DAILY 90 tablet 2   fenofibrate (TRICOR) 145 MG tablet Take 1 tablet (145 mg total) by mouth daily. 90 tablet 0   Finerenone (KERENDIA) 10 MG TABS Take 1 tablet by mouth daily. (Patient not taking: Reported on 02/22/2022) 90 tablet 1   fluticasone (FLONASE) 50 MCG/ACT nasal spray Place 2 sprays into both nostrils daily. 16 g 6   furosemide (LASIX) 20 MG tablet TAKE 1 TABLET DAILY 90 tablet 3   HYDROcodone-acetaminophen (NORCO/VICODIN) 5-325 MG tablet TAKE 1 TABLET BY MOUTH EVERY 6 HOURS AS NEEDED FOR MODERATE PAIN. 40 tablet 0   levocetirizine (XYZAL) 5 MG tablet Take by mouth every evening.     levothyroxine (SYNTHROID) 50 MCG tablet TAKE 1 TABLET BY MOUTH DAILY. 90 tablet 0   lisinopril (ZESTRIL) 20 MG tablet TAKE 1 TABLET BY MOUTH DAILY 90 tablet 0   metFORMIN (GLUCOPHAGE) 500 MG tablet TAKE 1 TABLET BY MOUTH 2 TIMES DAILY. 180 tablet 2   methocarbamol (ROBAXIN) 750 MG tablet TAKE 1 TABLET BY MOUTH 4 TIMES DAILY. 360 tablet 0   metoprolol succinate (TOPROL-XL) 25 MG 24 hr tablet Take 1 tablet (25 mg total) by mouth  daily. 90 tablet 3   Multiple Vitamins-Minerals (CENTRUM SILVER 50+MEN PO) Take by mouth.     olopatadine (PATANOL) 0.1 % ophthalmic solution Place 1 drop into both eyes 2 (two) times daily. 5 mL 12   omega-3 acid ethyl esters (LOVAZA) 1 g capsule TAKE 1 CAPSULE BY MOUTH 2 TIMES DAILY. 180 capsule 0   pregabalin (LYRICA) 300 MG capsule TAKE 1 CAPSULE BY MOUTH TWICE A DAY 180 capsule 1   promethazine (PHENERGAN) 25 MG tablet TAKE 1 TABLET BY MOUTH EVERY 6 HOURS AS NEEDED FOR NAUSEA AND VOMITING 60 tablet 0   sucralfate (CARAFATE) 1 g tablet Take 1 tablet (1 g total) by mouth 4 (four) times daily -  with meals and at bedtime. 120 tablet 2   traZODone (DESYREL) 50 MG tablet  TAKE 1 TO 2 TABLETS AT BEDTIME 180 tablet 3   valACYclovir (VALTREX) 1000 MG tablet TAKE 1 TABLET BY MOUTH TWICE DAILY FOR 7 DAYS AS NEEDED FOR COLD SORES 14 tablet 0   venlafaxine XR (EFFEXOR-XR) 150 MG 24 hr capsule TAKE 2 CAPSULES BY MOUTH DAILY WITH BREAKFAST. 180 capsule 1   vitamin B-12 (CYANOCOBALAMIN) 250 MCG tablet Take 250 mcg by mouth daily.     No current facility-administered medications on file prior to visit.   Past Medical History:  Diagnosis Date   Acute coronary syndrome ([place]) 02/04/2020   Anxiety    Arthritis    Cancer ([place])    kidney cancer   Depression    Diabetes mellitus without complication ([place]) 2536   type II   Essential hypertension    GERD (gastroesophageal reflux disease)    History of kidney stones    Mixed hyperlipidemia    Past Surgical History:  Procedure Laterality Date   CHOLECYSTECTOMY  2008   PARTIAL NEPHRECTOMY  2002   kidney cancer   SHOULDER SURGERY  2008    Family History  Problem Relation Age of Onset   Drug abuse Sister    Aortic stenosis Brother        aortic valve replacement: postop infection   Cancer Brother    Heart disease Other    Stroke Other    Hypertension Other    Diabetes Other    CAD Other    Social History   Socioeconomic History   Marital status: Married    Spouse name: Not on file   Number of children: 2   Years of education: Not on file   Highest education level: Not on file  Occupational History   Occupation: maintance  Tobacco Use   Smoking status: Never   Smokeless tobacco: Never  Substance and Sexual Activity   Alcohol use: Yes    Comment: has not drank since 2011   Drug use: No   Sexual activity: Not on file  Other Topics Concern   Not on file  Social History Narrative   Not on file   Social Determinants of Health   Financial Resource Strain: Not on file  Food Insecurity: Not on file  Transportation Needs: Not on file  Physical Activity: Not on file  Stress: Not on file  Social  Connections: Not on file    Review of Systems  Constitutional:  Negative for appetite change, fatigue and fever.  HENT:  Negative for congestion, ear pain, sinus pressure and sore throat.   Respiratory:  Negative for cough, chest tightness, shortness of breath and wheezing.   Cardiovascular:  Negative for chest pain and palpitations.  Gastrointestinal:  Negative for abdominal pain, constipation, diarrhea, nausea and vomiting.  Endocrine: Negative for polydipsia, polyphagia and polyuria.  Genitourinary:  Negative for dysuria and hematuria.  Musculoskeletal:  Positive for back pain. Negative for arthralgias, joint swelling and myalgias.  Skin:  Negative for rash.  Neurological:  Negative for dizziness, weakness and headaches.  Psychiatric/Behavioral:  Negative for dysphoric mood. The patient is not nervous/anxious.      Objective:  BP 120/76   Pulse 78   Temp (!) 96.3 F (35.7 C)   Resp 18   Ht 6' (1.829 m)   Wt 257 lb (116.6 kg)   BMI 34.86 kg/m      07/02/2022    7:35 AM 02/22/2022    8:06 AM 10/02/2021    7:36 AM  BP/Weight  Systolic BP 425 956 387  Diastolic BP 76 60 52  Wt. (Lbs) 257 264 261  BMI 34.86 kg/m2 35.8 kg/m2 35.4 kg/m2    Physical Exam Vitals reviewed.  Constitutional:      Appearance: Normal appearance. He is obese.  Neck:     Vascular: No carotid bruit.  Cardiovascular:     Rate and Rhythm: Normal rate and regular rhythm.     Heart sounds: Normal heart sounds.  Pulmonary:     Effort: Pulmonary effort is normal.     Breath sounds: Normal breath sounds.  Abdominal:     General: Abdomen is flat. Bowel sounds are normal.     Palpations: Abdomen is soft.     Tenderness: There is no abdominal tenderness.  Neurological:     Mental Status: He is alert and oriented to person, place, and time.  Psychiatric:        Mood and Affect: Mood normal.        Behavior: Behavior normal.     Diabetic Foot Exam - Simple   Simple Foot Form Diabetic Foot exam  was performed with the following findings: Yes 07/02/2022  8:04 AM  Visual Inspection No deformities, no ulcerations, no other skin breakdown bilaterally: Yes Sensation Testing Intact to touch and monofilament testing bilaterally: Yes Pulse Check Posterior Tibialis and Dorsalis pulse intact bilaterally: Yes Comments      Lab Results  Component Value Date   WBC 4.9 02/22/2022   HGB 11.7 (L) 02/22/2022   HCT 35.6 (L) 02/22/2022   PLT 246 02/22/2022   GLUCOSE 107 (H) 02/22/2022   CHOL 174 02/22/2022   TRIG 306 (H) 02/22/2022   HDL 45 02/22/2022   LDLCALC 79 02/22/2022   ALT 49 (H) 02/22/2022   AST 44 (H) 02/22/2022   NA 142 02/22/2022   K 4.8 02/22/2022   CL 104 02/22/2022   CREATININE 1.27 02/22/2022   BUN 14 02/22/2022   CO2 24 02/22/2022   TSH 1.560 02/22/2022   INR 1.04 03/28/2014   HGBA1C 6.2 (H) 02/22/2022   MICROALBUR 30 12/27/2020      Assessment & Plan:   Frederick Martinez was seen today for diabetes, hypertension and hyperlipidemia.  Gastroesophageal reflux disease without esophagitis Assessment & Plan: The current medical regimen is effective;  continue present plan and medications. Continue Esomeprazole 40 mg twice daily, Famotidine 40 mg twice daily   Acquired hypothyroidism Assessment & Plan: Previously well controlled Continue Synthroid at current dose  Recheck TSH and adjust Synthroid as indicated     Type 2 diabetes mellitus with polyneuropathy ([place]) Assessment & Plan: Control: good Recommend check sugars fasting daily. Recommend check feet daily. Recommend annual eye exams. Medicines: Continue Metformin 500  mg twice a day. Continue to work on eating a healthy diet and exercise.  Labs drawn today.     Orders: -     Hemoglobin A1c  Hypertensive kidney disease with stage 3a chronic kidney disease ([place]) Assessment & Plan: Stable. No changes to medicines. Continue Amlodipine 10 mg  daily, Lisinopril 20 mg daily, Metoprolol 25 mg daily twice a  day. Continue to work on eating a healthy diet and exercise.  Labs drawn today.    Mixed hyperlipidemia Assessment & Plan: Not at goal. Trigs up.  Continue Fenofibrate 145 mg daily, Lovaza 1 g twice a day, Atorvastatin 10 mg daily, Zetia 10 mg daily. Continue to work on eating a healthy diet and exercise.  Labs drawn today.    Orders: -     CBC With Diff/Platelet -     Comprehensive metabolic panel -     Lipid panel  Mild recurrent major depression (HCC) Assessment & Plan: The current medical regimen is effective;  continue present plan and medications. Continue Effexor 150 mg daily, Trazodone 50 mg at bedtime, Amitriptyline 25 mg daily, Wellbutrin 150 mg daily.    Need for COVID-19 vaccine [employer] Fall 2023 Covid-19 Vaccine 76yr and older  Class 1 obesity due to excess calories with serious comorbidity and body mass index (BMI) of 34.0 to 34.9 in adult Assessment & Plan: Recommend continue to work on eating healthy diet and exercise. Comorbidities: diabetes, hyperlipidemia   Chronic pain syndrome Assessment & Plan: Secondary to OA of knees and lumbar back pain.  Hydrocodone/apap 5/325 mg twice daily as needed (usually only takes once at night.       No orders of the defined types were placed in this encounter.   Orders Placed This Encounter  Procedures   [employer] 2023 Covid-19 Vaccine 146yrand older   CBC With Diff/Platelet   Comprehensive metabolic panel   Lipid panel   Hemoglobin A1c     Follow-up: Return in about 4 months (around 10/31/2022) for chronic fasting.  An After Visit Summary was printed and given to the patient.  KiRochel BromeMD [facility] [phone]

## 2022-07-02 ENCOUNTER — Other Ambulatory Visit: Payer: Self-pay

## 2022-07-02 ENCOUNTER — Ambulatory Visit: Payer: 59 | Admitting: Family Medicine

## 2022-07-02 VITALS — BP 120/76 | HR 78 | Temp 96.3°F | Resp 18 | Ht 72.0 in | Wt 257.0 lb

## 2022-07-02 DIAGNOSIS — K219 Gastro-esophageal reflux disease without esophagitis: Secondary | ICD-10-CM

## 2022-07-02 DIAGNOSIS — N1831 Chronic kidney disease, stage 3a: Secondary | ICD-10-CM

## 2022-07-02 DIAGNOSIS — E1142 Type 2 diabetes mellitus with diabetic polyneuropathy: Secondary | ICD-10-CM

## 2022-07-02 DIAGNOSIS — I129 Hypertensive chronic kidney disease with stage 1 through stage 4 chronic kidney disease, or unspecified chronic kidney disease: Secondary | ICD-10-CM | POA: Diagnosis not present

## 2022-07-02 DIAGNOSIS — Z23 Encounter for immunization: Secondary | ICD-10-CM | POA: Diagnosis not present

## 2022-07-02 DIAGNOSIS — G894 Chronic pain syndrome: Secondary | ICD-10-CM | POA: Diagnosis not present

## 2022-07-02 DIAGNOSIS — E6609 Other obesity due to excess calories: Secondary | ICD-10-CM | POA: Diagnosis not present

## 2022-07-02 DIAGNOSIS — E782 Mixed hyperlipidemia: Secondary | ICD-10-CM

## 2022-07-02 DIAGNOSIS — Z6834 Body mass index (BMI) 34.0-34.9, adult: Secondary | ICD-10-CM

## 2022-07-02 DIAGNOSIS — F330 Major depressive disorder, recurrent, mild: Secondary | ICD-10-CM

## 2022-07-02 DIAGNOSIS — R69 Illness, unspecified: Secondary | ICD-10-CM | POA: Diagnosis not present

## 2022-07-02 DIAGNOSIS — E039 Hypothyroidism, unspecified: Secondary | ICD-10-CM | POA: Diagnosis not present

## 2022-07-02 DIAGNOSIS — I152 Hypertension secondary to endocrine disorders: Secondary | ICD-10-CM

## 2022-07-02 MED ORDER — FAMOTIDINE 40 MG PO TABS: 40.0000 mg | ORAL_TABLET | Freq: Two times a day (BID) | ORAL | 3 refills | Status: DC

## 2022-07-03 ENCOUNTER — Encounter: Payer: Self-pay | Admitting: Family Medicine

## 2022-07-03 DIAGNOSIS — G894 Chronic pain syndrome: Secondary | ICD-10-CM | POA: Insufficient documentation

## 2022-07-03 DIAGNOSIS — Z23 Encounter for immunization: Secondary | ICD-10-CM | POA: Insufficient documentation

## 2022-07-03 DIAGNOSIS — E66811 Obesity, class 1: Secondary | ICD-10-CM | POA: Insufficient documentation

## 2022-07-03 DIAGNOSIS — E6609 Other obesity due to excess calories: Secondary | ICD-10-CM | POA: Insufficient documentation

## 2022-07-03 HISTORY — DX: Chronic pain syndrome: G89.4

## 2022-07-03 NOTE — Assessment & Plan Note (Signed)
Not at goal. Trigs up.  Continue Fenofibrate 145 mg daily, Lovaza 1 g twice a day, Atorvastatin 10 mg daily, Zetia 10 mg daily. Continue to work on eating a healthy diet and exercise.  Labs drawn today.

## 2022-07-03 NOTE — Assessment & Plan Note (Signed)
Recommend continue to work on eating healthy diet and exercise.  °Comorbidities: diabetes, hyperlipidemia °

## 2022-07-03 NOTE — Assessment & Plan Note (Signed)
Secondary to OA of knees and lumbar back pain.  Hydrocodone/apap 5/325 mg twice daily as needed (usually only takes once at night.

## 2022-07-03 NOTE — Assessment & Plan Note (Signed)
The current medical regimen is effective;  continue present plan and medications. Continue Effexor 150 mg daily, Trazodone 50 mg at bedtime, Amitriptyline 25 mg daily, Wellbutrin 150 mg daily.

## 2022-07-03 NOTE — Assessment & Plan Note (Signed)
Stable. No changes to medicines. Continue Amlodipine 10 mg  daily, Lisinopril 20 mg daily, Metoprolol 25 mg daily twice a day. Continue to work on eating a healthy diet and exercise.  Labs drawn today.

## 2022-07-03 NOTE — Assessment & Plan Note (Signed)
Control: good Recommend check sugars fasting daily. Recommend check feet daily. Recommend annual eye exams. Medicines: Continue Metformin 500 mg twice a day. Continue to work on eating a healthy diet and exercise.  Labs drawn today.

## 2022-07-03 NOTE — Assessment & Plan Note (Signed)
The current medical regimen is effective;  continue present plan and medications. Continue Esomeprazole 40 mg twice daily, Famotidine 40 mg twice daily

## 2022-07-03 NOTE — Assessment & Plan Note (Signed)
Previously well controlled Continue Synthroid at current dose  Recheck TSH and adjust Synthroid as indicated   

## 2022-07-03 NOTE — Assessment & Plan Note (Signed)
Well controlled.  No changes to medicines. Continue Amlodipine 10 mg  daily, Lisinopril 20 mg daily, Metoprolol 25 mg daily twice a day. Continue to work on eating a healthy diet and exercise.  Labs drawn today.

## 2022-07-12 LAB — CBC WITH DIFF/PLATELET
Basophils Absolute: 0 10*3/uL (ref 0.0–0.2)
Basos: 1 %
EOS (ABSOLUTE): 0.3 10*3/uL (ref 0.0–0.4)
Eos: 5 %
Hematocrit: 35.7 % — ABNORMAL LOW (ref 37.5–51.0)
Hemoglobin: 12 g/dL — ABNORMAL LOW (ref 13.0–17.7)
Immature Grans (Abs): 0 10*3/uL (ref 0.0–0.1)
Immature Granulocytes: 0 %
Lymphocytes Absolute: 1.1 10*3/uL (ref 0.7–3.1)
Lymphs: 20 %
MCH: 30.7 pg (ref 26.6–33.0)
MCHC: 33.6 g/dL (ref 31.5–35.7)
MCV: 91 fL (ref 79–97)
Monocytes Absolute: 0.4 10*3/uL (ref 0.1–0.9)
Monocytes: 7 %
Neutrophils Absolute: 3.7 10*3/uL (ref 1.4–7.0)
Neutrophils: 67 %
Platelets: 234 10*3/uL (ref 150–450)
RBC: 3.91 x10E6/uL — ABNORMAL LOW (ref 4.14–5.80)
RDW: 12.5 % (ref 11.6–15.4)
WBC: 5.5 10*3/uL (ref 3.4–10.8)

## 2022-07-12 LAB — COMPREHENSIVE METABOLIC PANEL
ALT: 44 IU/L (ref 0–44)
AST: 47 IU/L — ABNORMAL HIGH (ref 0–40)
Albumin/Globulin Ratio: 2.6 — ABNORMAL HIGH (ref 1.2–2.2)
Albumin: 5.1 g/dL — ABNORMAL HIGH (ref 3.8–4.9)
Alkaline Phosphatase: 43 IU/L — ABNORMAL LOW (ref 44–121)
BUN/Creatinine Ratio: 12 (ref 9–20)
BUN: 16 mg/dL (ref 6–24)
Bilirubin Total: 0.3 mg/dL (ref 0.0–1.2)
CO2: 24 mmol/L (ref 20–29)
Calcium: 10.7 mg/dL — ABNORMAL HIGH (ref 8.7–10.2)
Chloride: 103 mmol/L (ref 96–106)
Creatinine, Ser: 1.31 mg/dL — ABNORMAL HIGH (ref 0.76–1.27)
Globulin, Total: 2 g/dL (ref 1.5–4.5)
Glucose: 101 mg/dL — ABNORMAL HIGH (ref 70–99)
Potassium: 4.4 mmol/L (ref 3.5–5.2)
Sodium: 140 mmol/L (ref 134–144)
Total Protein: 7.1 g/dL (ref 6.0–8.5)
eGFR: 63 mL/min/{1.73_m2} (ref >59)

## 2022-07-12 LAB — HEMOGLOBIN A1C
Est. average glucose Bld gHb Est-mCnc: 126 mg/dL
Hgb A1c MFr Bld: 6 % — ABNORMAL HIGH (ref 4.8–5.6)

## 2022-07-12 LAB — MICROALBUMIN / CREATININE URINE RATIO

## 2022-07-12 LAB — LIPID PANEL
Chol/HDL Ratio: 3.5 ratio (ref 0.0–5.0)
Cholesterol, Total: 159 mg/dL (ref 100–199)
HDL: 46 mg/dL (ref >39)
LDL Chol Calc (NIH): 75 mg/dL (ref 0–99)
Triglycerides: 234 mg/dL — ABNORMAL HIGH (ref 0–149)
VLDL Cholesterol Cal: 38 mg/dL (ref 5–40)

## 2022-07-12 LAB — CARDIOVASCULAR RISK ASSESSMENT

## 2022-07-12 NOTE — Progress Notes (Signed)
Blood count: Anemia stable.   Liver function barely elevated.  Kidney function abnormal but improving.  Cholesterol: Triglycerides elevated.  234.  They are improved but still not at goal.  Recommend increase Lovaza to 2 g twice daily.  Continue other cholesterol medications. HBA1C: 6 improved.

## 2022-07-13 ENCOUNTER — Other Ambulatory Visit: Payer: Self-pay

## 2022-07-13 MED ORDER — OMEGA-3-ACID ETHYL ESTERS 1 G PO CAPS: 2.0000 | ORAL_CAPSULE | Freq: Two times a day (BID) | ORAL | 0 refills | Status: DC

## 2022-07-14 ENCOUNTER — Other Ambulatory Visit: Payer: Self-pay | Admitting: Family Medicine

## 2022-07-29 ENCOUNTER — Other Ambulatory Visit: Payer: Self-pay | Admitting: Physician Assistant

## 2022-07-29 ENCOUNTER — Other Ambulatory Visit: Payer: Self-pay | Admitting: Family Medicine

## 2022-07-30 ENCOUNTER — Other Ambulatory Visit: Payer: Self-pay | Admitting: Family Medicine

## 2022-08-11 ENCOUNTER — Other Ambulatory Visit: Payer: Self-pay | Admitting: Family Medicine

## 2022-08-16 DIAGNOSIS — R5381 Other malaise: Secondary | ICD-10-CM | POA: Diagnosis not present

## 2022-08-16 DIAGNOSIS — L02416 Cutaneous abscess of left lower limb: Secondary | ICD-10-CM | POA: Diagnosis not present

## 2022-08-16 DIAGNOSIS — E119 Type 2 diabetes mellitus without complications: Secondary | ICD-10-CM | POA: Diagnosis not present

## 2022-08-27 ENCOUNTER — Other Ambulatory Visit: Payer: Self-pay | Admitting: Physician Assistant

## 2022-08-27 DIAGNOSIS — E782 Mixed hyperlipidemia: Secondary | ICD-10-CM

## 2022-08-30 DIAGNOSIS — L97929 Non-pressure chronic ulcer of unspecified part of left lower leg with unspecified severity: Secondary | ICD-10-CM | POA: Diagnosis not present

## 2022-09-08 ENCOUNTER — Other Ambulatory Visit: Payer: Self-pay | Admitting: Family Medicine

## 2022-09-24 ENCOUNTER — Other Ambulatory Visit: Payer: Self-pay | Admitting: Family Medicine

## 2022-10-13 ENCOUNTER — Other Ambulatory Visit: Payer: Self-pay | Admitting: Family Medicine

## 2022-10-26 ENCOUNTER — Other Ambulatory Visit: Payer: Self-pay | Admitting: Family Medicine

## 2022-11-04 NOTE — Progress Notes (Unsigned)
Subjective:  Patient ID: Frederick Martinez, male    DOB: [birth year]  Age: 62 y.o. MRN: [id number]  No chief complaint on file.   HPI  Well Adult Physical: Patient here for a comprehensive physical exam.The patient reports  allergies and back pain,  Do you take any herbs or supplements that were not prescribed by a doctor? no Are you taking calcium supplements? no Are you taking aspirin daily? no  Encounter for general adult medical examination without abnormal findings  Physical ("At Risk" items are starred): Patient's last physical exam was 1 year ago .  Patient wears a seat belt, has smoke detectors, has carbon monoxide detectors, practices appropriate gun safety, and wears sunscreen with extended sun exposure. Dental Care: biannual cleanings, brushes and flosses daily. Ophthalmology/Optometry: Annual visit.  Hearing loss: none Vision impairments: none Last PSA:  Diabetes:  Complications: hyperlipidemia, hypertension Glucose checking: infrequent. Only when feels bad.  Most recent A1C: 6.0% Current medications: Metformin 500 mg twice a day. Last Eye Exam: 02/17/2022 Foot checks: daily  CKD 3A: Discontinued kerendia by nephrology. Discontinued meloxicam.  Depression: Effexor 150 mg daily, Trazodone 50 mg at bedtime, Amitriptyline 25 mg daily, Wellbutrin 150 mg daily. Doing well.    07/02/2022    7:38 AM 02/22/2022    8:09 AM 04/03/2021   11:01 AM  PHQ9 SCORE ONLY  PHQ-9 Total Score 0 1 0    Hyperlipidemia: Current medications: Fenofibrate 145 mg daily, Lovaza 1 g twice a day, Atorvastatin 10 mg daily, Zetia 10 mg daily. Trying to eat healthier. Active in his job.   Hypertension: Current medications: Amlodipine 10 mg  daily, Lisinopril 20 mg daily, Metoprolol 25 mg daily twice a day.  GERD: Taking Esomeprazole 40 mg twice daily, Famotidine 40 mg twice daily. Controlled.   Chronic pain syndrome: Hydrocodone/apap sparingly, but usually once at night. Takes tylenol if flares  his back up. No nsaids.       07/02/2022    7:38 AM 02/22/2022    8:09 AM 04/03/2021   11:01 AM 05/19/2020   12:10 PM  Depression screen PHQ 2/9  Decreased Interest 0 0 0 0  Down, Depressed, Hopeless 0 0 0 0  PHQ - 2 Score 0 0 0 0  Altered sleeping  0 0 0  Tired, decreased energy  0 0 0  Change in appetite  1 0 0  Feeling bad or failure about yourself   0 0 0  Trouble concentrating   0 0  Moving slowly or fidgety/restless   0 0  Suicidal thoughts   0 0  PHQ-9 Score  1 0 0  Difficult doing work/chores   Not difficult at all          07/02/2022    7:38 AM  Fall Risk  Falls in the past year? 0  Was there an injury with Fall? 0  Fall Risk Category Calculator 0  Patient at Risk for Falls Due to No Fall Risks  Fall risk Follow up Falls evaluation completed              Past Medical History:  Diagnosis Date   Acute coronary syndrome (HCC) 02/04/2020   Anxiety    Arthritis    Cancer (HCC)    kidney cancer   Depression    Diabetes mellitus without complication (HCC) 2011   type II   Essential hypertension    GERD (gastroesophageal reflux disease)    History of kidney stones    Mixed hyperlipidemia  Past Surgical History:  Procedure Laterality Date   CHOLECYSTECTOMY  2008   PARTIAL NEPHRECTOMY  2002   kidney cancer   SHOULDER SURGERY  2008    Family History  Problem Relation Age of Onset   Drug abuse Sister    Aortic stenosis Brother        aortic valve replacement: postop infection   Cancer Brother    Heart disease Other    Stroke Other    Hypertension Other    Diabetes Other    CAD Other    Social History   Socioeconomic History   Marital status: Married    Spouse name: Not on file   Number of children: 2   Years of education: Not on file   Highest education level: Not on file  Occupational History   Occupation: maintance  Tobacco Use   Smoking status: Never   Smokeless tobacco: Never  Substance and Sexual Activity   Alcohol use: Yes     Comment: has not drank since 2011   Drug use: No   Sexual activity: Not on file  Other Topics Concern   Not on file  Social History Narrative   Not on file   Social Determinants of Health   Financial Resource Strain: Not on file  Food Insecurity: Not on file  Transportation Needs: Not on file  Physical Activity: Not on file  Stress: Not on file  Social Connections: Not on file   Review of Systems  Constitutional:  Negative for chills and fever.  HENT:  Positive for congestion. Negative for rhinorrhea and sore throat.   Respiratory:  Negative for cough and shortness of breath.   Cardiovascular:  Negative for chest pain and palpitations.  Gastrointestinal:  Negative for abdominal pain, constipation, diarrhea, nausea and vomiting.  Genitourinary:  Negative for dysuria and urgency.  Musculoskeletal:  Positive for arthralgias and back pain. Negative for myalgias.  Neurological:  Negative for dizziness and headaches.  Psychiatric/Behavioral:  Negative for dysphoric mood. The patient is not nervous/anxious.      Objective:  There were no vitals taken for this visit.     07/02/2022    7:35 AM 02/22/2022    8:06 AM 10/02/2021    7:36 AM  BP/Weight  Systolic BP 120 108 100  Diastolic BP 76 60 52  Wt. (Lbs) 257 264 261  BMI 34.86 kg/m2 35.8 kg/m2 35.4 kg/m2    Physical Exam Vitals reviewed.  Constitutional:      Appearance: He is normal weight.  HENT:     Right Ear: Tympanic membrane normal.     Left Ear: Tympanic membrane normal.     Nose: Congestion present.     Right Turbinates: Swollen and pale.     Left Turbinates: Swollen and pale.  Neck:     Vascular: No carotid bruit.  Cardiovascular:     Rate and Rhythm: Normal rate and regular rhythm.  Pulmonary:     Effort: Pulmonary effort is normal. No respiratory distress.     Breath sounds: Normal breath sounds.  Abdominal:     General: Bowel sounds are normal.     Palpations: Abdomen is soft.     Tenderness: There is  no abdominal tenderness.  Neurological:     Mental Status: He is alert and oriented to person, place, and time.  Psychiatric:        Mood and Affect: Mood normal.        Behavior: Behavior normal.     Lab  Results  Component Value Date   WBC 5.5 07/02/2022   HGB 12.0 (L) 07/02/2022   HCT 35.7 (L) 07/02/2022   PLT 234 07/02/2022   GLUCOSE 101 (H) 07/02/2022   CHOL 159 07/02/2022   TRIG 234 (H) 07/02/2022   HDL 46 07/02/2022   LDLCALC 75 07/02/2022   ALT 44 07/02/2022   AST 47 (H) 07/02/2022   NA 140 07/02/2022   K 4.4 07/02/2022   CL 103 07/02/2022   CREATININE 1.31 (H) 07/02/2022   BUN 16 07/02/2022   CO2 24 07/02/2022   TSH 1.560 02/22/2022   INR 1.04 03/28/2014   HGBA1C 6.0 (H) 07/02/2022   MICROALBUR 30 12/27/2020      Assessment & Plan:  No problem-specific Assessment & Plan notes found for this encounter.    There is no height or weight on file to calculate BMI.   These are the goals we discussed:  Goals   None      This is a list of the screening recommended for you and due dates:  Health Maintenance  Topic Date Due   HIV Screening  Never done   Hepatitis C Screening: USPSTF Recommendation to screen - Ages 67-79 yo.  Never done   Zoster (Shingles) Vaccine (1 of 2) Never done   Hemoglobin A1C  12/31/2022   Flu Shot  01/13/2023   Eye exam for diabetics  02/18/2023   Yearly kidney function blood test for diabetes  07/03/2023   Yearly kidney health urinalysis for diabetes  07/03/2023   Complete foot exam   07/03/2023   Colon Cancer Screening  12/20/2027   DTaP/Tdap/Td vaccine (2 - Td or Tdap) 02/23/2032   COVID-19 Vaccine  Completed   HPV Vaccine  Aged Out     No orders of the defined types were placed in this encounter.    Follow-up: No follow-ups on file.  An After Visit Summary was printed and given to the patient.  Blane Ohara, MD [facility] [phone]

## 2022-11-05 ENCOUNTER — Encounter: Payer: Self-pay | Admitting: Family Medicine

## 2022-11-05 ENCOUNTER — Ambulatory Visit (INDEPENDENT_AMBULATORY_CARE_PROVIDER_SITE_OTHER): Payer: 59 | Admitting: Family Medicine

## 2022-11-05 VITALS — BP 110/60 | HR 78 | Temp 97.0°F | Resp 18 | Ht 72.0 in | Wt 260.0 lb

## 2022-11-05 DIAGNOSIS — Z0001 Encounter for general adult medical examination with abnormal findings: Secondary | ICD-10-CM

## 2022-11-05 DIAGNOSIS — Z23 Encounter for immunization: Secondary | ICD-10-CM

## 2022-11-05 DIAGNOSIS — N1831 Chronic kidney disease, stage 3a: Secondary | ICD-10-CM | POA: Diagnosis not present

## 2022-11-05 DIAGNOSIS — E782 Mixed hyperlipidemia: Secondary | ICD-10-CM | POA: Diagnosis not present

## 2022-11-05 DIAGNOSIS — E1142 Type 2 diabetes mellitus with diabetic polyneuropathy: Secondary | ICD-10-CM

## 2022-11-05 DIAGNOSIS — I129 Hypertensive chronic kidney disease with stage 1 through stage 4 chronic kidney disease, or unspecified chronic kidney disease: Secondary | ICD-10-CM | POA: Diagnosis not present

## 2022-11-05 DIAGNOSIS — Z0000 Encounter for general adult medical examination without abnormal findings: Secondary | ICD-10-CM

## 2022-11-05 MED ORDER — MONTELUKAST SODIUM 10 MG PO TABS: 10.0000 mg | ORAL_TABLET | Freq: Every day | ORAL | 3 refills | Status: DC

## 2022-11-05 NOTE — Patient Instructions (Signed)
Work on [employer] and exercise.

## 2022-11-06 LAB — CBC WITH DIFFERENTIAL/PLATELET
Basophils Absolute: 0.1 10*3/uL (ref 0.0–0.2)
Basos: 1 %
EOS (ABSOLUTE): 0.2 10*3/uL (ref 0.0–0.4)
Eos: 6 %
Hematocrit: 32.7 % — ABNORMAL LOW (ref 37.5–51.0)
Hemoglobin: 11.1 g/dL — ABNORMAL LOW (ref 13.0–17.7)
Immature Grans (Abs): 0 10*3/uL (ref 0.0–0.1)
Immature Granulocytes: 0 %
Lymphocytes Absolute: 1.1 10*3/uL (ref 0.7–3.1)
Lymphs: 28 %
MCH: 31 pg (ref 26.6–33.0)
MCHC: 33.9 g/dL (ref 31.5–35.7)
MCV: 91 fL (ref 79–97)
Monocytes Absolute: 0.4 10*3/uL (ref 0.1–0.9)
Monocytes: 9 %
Neutrophils Absolute: 2.1 10*3/uL (ref 1.4–7.0)
Neutrophils: 56 %
Platelets: 241 10*3/uL (ref 150–450)
RBC: 3.58 x10E6/uL — ABNORMAL LOW (ref 4.14–5.80)
RDW: 12.1 % (ref 11.6–15.4)
WBC: 3.8 10*3/uL (ref 3.4–10.8)

## 2022-11-06 LAB — COMPREHENSIVE METABOLIC PANEL
ALT: 49 IU/L — ABNORMAL HIGH (ref 0–44)
AST: 48 IU/L — ABNORMAL HIGH (ref 0–40)
Albumin/Globulin Ratio: 2.3 — ABNORMAL HIGH (ref 1.2–2.2)
Albumin: 4.8 g/dL (ref 3.8–4.9)
Alkaline Phosphatase: 38 IU/L — ABNORMAL LOW (ref 44–121)
BUN/Creatinine Ratio: 14 (ref 10–24)
BUN: 19 mg/dL (ref 8–27)
Bilirubin Total: 0.3 mg/dL (ref 0.0–1.2)
CO2: 22 mmol/L (ref 20–29)
Calcium: 10.4 mg/dL — ABNORMAL HIGH (ref 8.6–10.2)
Chloride: 104 mmol/L (ref 96–106)
Creatinine, Ser: 1.39 mg/dL — ABNORMAL HIGH (ref 0.76–1.27)
Globulin, Total: 2.1 g/dL (ref 1.5–4.5)
Glucose: 109 mg/dL — ABNORMAL HIGH (ref 70–99)
Potassium: 4.5 mmol/L (ref 3.5–5.2)
Sodium: 140 mmol/L (ref 134–144)
Total Protein: 6.9 g/dL (ref 6.0–8.5)
eGFR: 58 mL/min/{1.73_m2} — ABNORMAL LOW (ref >59)

## 2022-11-06 LAB — HEMOGLOBIN A1C
Est. average glucose Bld gHb Est-mCnc: 131 mg/dL
Hgb A1c MFr Bld: 6.2 % — ABNORMAL HIGH (ref 4.8–5.6)

## 2022-11-06 LAB — LIPID PANEL
Chol/HDL Ratio: 3.4 ratio (ref 0.0–5.0)
Cholesterol, Total: 150 mg/dL (ref 100–199)
HDL: 44 mg/dL (ref >39)
LDL Chol Calc (NIH): 75 mg/dL (ref 0–99)
Triglycerides: 181 mg/dL — ABNORMAL HIGH (ref 0–149)
VLDL Cholesterol Cal: 31 mg/dL (ref 5–40)

## 2022-11-06 LAB — PSA: Prostate Specific Ag, Serum: 0.3 ng/mL (ref 0.0–4.0)

## 2022-11-06 LAB — CARDIOVASCULAR RISK ASSESSMENT

## 2022-11-07 NOTE — Assessment & Plan Note (Signed)
Controlled Continue Fenofibrate 145 mg daily, Lovaza 1 g twice a day, Atorvastatin 10 mg daily, Zetia 10 mg daily. Continue to work on eating a healthy diet and exercise.  Labs drawn today.

## 2022-11-07 NOTE — Assessment & Plan Note (Signed)
Stable. No changes to medicines. Continue Amlodipine 10 mg  daily, Lisinopril 20 mg daily, Metoprolol 25 mg daily twice a day. Continue to work on eating a healthy diet and exercise.  Labs drawn today.  

## 2022-11-07 NOTE — Assessment & Plan Note (Signed)
Control: good Recommend check sugars fasting daily. Recommend check feet daily. Recommend annual eye exams. Medicines: Continue Metformin 500 mg twice a day. Continue to work on eating a healthy diet and exercise.  Labs drawn today.    

## 2022-11-08 ENCOUNTER — Encounter: Payer: Self-pay | Admitting: Family Medicine

## 2022-11-08 DIAGNOSIS — Z0000 Encounter for general adult medical examination without abnormal findings: Secondary | ICD-10-CM | POA: Insufficient documentation

## 2022-11-08 HISTORY — DX: Encounter for general adult medical examination without abnormal findings: Z00.00

## 2022-11-08 NOTE — Assessment & Plan Note (Signed)
Education given.  Work on [employer] and exercise.

## 2022-11-09 ENCOUNTER — Other Ambulatory Visit: Payer: Self-pay

## 2022-11-09 DIAGNOSIS — D582 Other hemoglobinopathies: Secondary | ICD-10-CM

## 2022-11-16 ENCOUNTER — Other Ambulatory Visit: Payer: Self-pay | Admitting: Family Medicine

## 2022-11-16 DIAGNOSIS — Z79899 Other long term (current) drug therapy: Secondary | ICD-10-CM | POA: Diagnosis not present

## 2022-11-16 DIAGNOSIS — X58XXXA Exposure to other specified factors, initial encounter: Secondary | ICD-10-CM | POA: Diagnosis not present

## 2022-11-16 DIAGNOSIS — M47816 Spondylosis without myelopathy or radiculopathy, lumbar region: Secondary | ICD-10-CM | POA: Diagnosis not present

## 2022-11-16 DIAGNOSIS — M545 Low back pain, unspecified: Secondary | ICD-10-CM | POA: Diagnosis not present

## 2022-11-16 DIAGNOSIS — S335XXA Sprain of ligaments of lumbar spine, initial encounter: Secondary | ICD-10-CM | POA: Diagnosis not present

## 2022-11-16 DIAGNOSIS — I10 Essential (primary) hypertension: Secondary | ICD-10-CM | POA: Diagnosis not present

## 2022-11-16 DIAGNOSIS — E119 Type 2 diabetes mellitus without complications: Secondary | ICD-10-CM | POA: Diagnosis not present

## 2022-11-16 DIAGNOSIS — Z7984 Long term (current) use of oral hypoglycemic drugs: Secondary | ICD-10-CM | POA: Diagnosis not present

## 2022-11-16 DIAGNOSIS — M5136 Other intervertebral disc degeneration, lumbar region: Secondary | ICD-10-CM | POA: Diagnosis not present

## 2022-11-25 ENCOUNTER — Other Ambulatory Visit: Payer: Self-pay | Admitting: Family Medicine

## 2022-12-03 ENCOUNTER — Other Ambulatory Visit: Payer: Self-pay | Admitting: Family Medicine

## 2022-12-10 ENCOUNTER — Other Ambulatory Visit: Payer: 59

## 2022-12-20 ENCOUNTER — Other Ambulatory Visit: Payer: 59

## 2022-12-20 DIAGNOSIS — D582 Other hemoglobinopathies: Secondary | ICD-10-CM

## 2022-12-20 LAB — CBC WITH DIFFERENTIAL/PLATELET
Basophils Absolute: 0.1 10*3/uL (ref 0.0–0.2)
Basos: 1 %
EOS (ABSOLUTE): 0.2 10*3/uL (ref 0.0–0.4)
Eos: 4 %
Hematocrit: 35.9 % — ABNORMAL LOW (ref 37.5–51.0)
Hemoglobin: 11.4 g/dL — ABNORMAL LOW (ref 13.0–17.7)
Immature Grans (Abs): 0 10*3/uL (ref 0.0–0.1)
Immature Granulocytes: 0 %
Lymphocytes Absolute: 1.2 10*3/uL (ref 0.7–3.1)
Lymphs: 25 %
MCH: 29.9 pg (ref 26.6–33.0)
MCHC: 31.8 g/dL (ref 31.5–35.7)
MCV: 94 fL (ref 79–97)
Monocytes Absolute: 0.4 10*3/uL (ref 0.1–0.9)
Monocytes: 8 %
Neutrophils Absolute: 3.1 10*3/uL (ref 1.4–7.0)
Neutrophils: 62 %
Platelets: 283 10*3/uL (ref 150–450)
RBC: 3.81 x10E6/uL — ABNORMAL LOW (ref 4.14–5.80)
RDW: 12.1 % (ref 11.6–15.4)
WBC: 5 10*3/uL (ref 3.4–10.8)

## 2022-12-21 ENCOUNTER — Other Ambulatory Visit: Payer: Self-pay

## 2022-12-21 DIAGNOSIS — D538 Other specified nutritional anemias: Secondary | ICD-10-CM

## 2022-12-22 ENCOUNTER — Other Ambulatory Visit: Payer: 59

## 2022-12-27 ENCOUNTER — Other Ambulatory Visit: Payer: 59

## 2022-12-28 ENCOUNTER — Other Ambulatory Visit: Payer: 59

## 2022-12-28 DIAGNOSIS — D538 Other specified nutritional anemias: Secondary | ICD-10-CM | POA: Diagnosis not present

## 2022-12-29 LAB — B12 AND FOLATE PANEL
Folate: 13.2 ng/mL (ref >3.0)
Vitamin B-12: 824 pg/mL (ref 232–1245)

## 2023-01-03 ENCOUNTER — Other Ambulatory Visit: Payer: Self-pay | Admitting: Family Medicine

## 2023-01-07 ENCOUNTER — Ambulatory Visit: Payer: 59

## 2023-01-14 ENCOUNTER — Other Ambulatory Visit: Payer: Self-pay | Admitting: Family Medicine

## 2023-01-17 ENCOUNTER — Telehealth: Payer: Self-pay

## 2023-01-17 NOTE — Telephone Encounter (Signed)
PA submitted and approved via covermymeds for lyrica.

## 2023-01-25 ENCOUNTER — Other Ambulatory Visit: Payer: Self-pay | Admitting: Family Medicine

## 2023-01-28 ENCOUNTER — Ambulatory Visit: Payer: 59

## 2023-01-31 ENCOUNTER — Other Ambulatory Visit: Payer: Self-pay | Admitting: Family Medicine

## 2023-03-01 ENCOUNTER — Other Ambulatory Visit: Payer: Self-pay | Admitting: Physician Assistant

## 2023-03-01 ENCOUNTER — Other Ambulatory Visit: Payer: Self-pay | Admitting: Family Medicine

## 2023-03-01 DIAGNOSIS — E782 Mixed hyperlipidemia: Secondary | ICD-10-CM

## 2023-03-11 ENCOUNTER — Ambulatory Visit: Payer: 59 | Admitting: Family Medicine

## 2023-03-11 VITALS — BP 106/58 | HR 72 | Temp 97.0°F | Resp 16 | Ht 72.0 in | Wt 263.0 lb

## 2023-03-11 DIAGNOSIS — G8929 Other chronic pain: Secondary | ICD-10-CM

## 2023-03-11 DIAGNOSIS — E1142 Type 2 diabetes mellitus with diabetic polyneuropathy: Secondary | ICD-10-CM | POA: Diagnosis not present

## 2023-03-11 DIAGNOSIS — I129 Hypertensive chronic kidney disease with stage 1 through stage 4 chronic kidney disease, or unspecified chronic kidney disease: Secondary | ICD-10-CM

## 2023-03-11 DIAGNOSIS — E039 Hypothyroidism, unspecified: Secondary | ICD-10-CM | POA: Diagnosis not present

## 2023-03-11 DIAGNOSIS — M545 Low back pain, unspecified: Secondary | ICD-10-CM | POA: Insufficient documentation

## 2023-03-11 DIAGNOSIS — N1831 Chronic kidney disease, stage 3a: Secondary | ICD-10-CM | POA: Diagnosis not present

## 2023-03-11 DIAGNOSIS — Z114 Encounter for screening for human immunodeficiency virus [HIV]: Secondary | ICD-10-CM | POA: Insufficient documentation

## 2023-03-11 DIAGNOSIS — E782 Mixed hyperlipidemia: Secondary | ICD-10-CM

## 2023-03-11 DIAGNOSIS — Z23 Encounter for immunization: Secondary | ICD-10-CM

## 2023-03-11 DIAGNOSIS — Z1159 Encounter for screening for other viral diseases: Secondary | ICD-10-CM | POA: Insufficient documentation

## 2023-03-11 HISTORY — DX: Other chronic pain: G89.29

## 2023-03-11 NOTE — Progress Notes (Unsigned)
duplicate

## 2023-03-11 NOTE — Progress Notes (Unsigned)
Subjective:  Patient ID: Frederick Martinez, male    DOB: 26-Jul-1960  Age: 62 y.o. MRN: [id number]  Chief Complaint  Patient presents with   Medical Management of Chronic Issues   HPI   Diabetes:  Complications: hyperlipidemia, hypertension Glucose checking: infrequent. Only when feels bad.  Most recent A1C: 6.2% Current medications: Metformin 500 mg twice a day. Last Eye Exam: 02/17/2022 Foot checks: daily  CKD 3A: No kerendia per nephrologist. Discontinued meloxicam.   Depression: Effexor 150 mg daily, Trazodone 50 mg at bedtime, Amitriptyline 25 mg daily, Wellbutrin 150 mg daily. Doing well.     03/11/2023    7:54 AM 11/05/2022    7:39 AM 11/05/2022    7:34 AM 07/02/2022    7:38 AM 02/22/2022    8:09 AM  Depression screen PHQ 2/9  Decreased Interest 0 0 0 0 0  Down, Depressed, Hopeless 0 0 0 0 0  PHQ - 2 Score 0 0 0 0 0  Altered sleeping     0  Tired, decreased energy     0  Change in appetite     1  Feeling bad or failure about yourself      0  PHQ-9 Score     1        03/11/2023    7:54 AM  Fall Risk   Falls in the past year? 0  Number falls in past yr: 0  Injury with Fall? 0  Risk for fall due to : No Fall Risks    Patient Care Team: Blane Ohara, MD as PCP - General (Family Medicine)   Review of Systems  Constitutional:  Negative for chills, fatigue and fever.  HENT:  Negative for congestion, ear pain and sore throat.   Respiratory:  Negative for cough and shortness of breath.   Cardiovascular:  Negative for chest pain.  Gastrointestinal:  Positive for abdominal pain and diarrhea. Negative for constipation, nausea and vomiting.  Endocrine: Negative for polydipsia, polyphagia and polyuria.  Genitourinary:  Negative for dysuria and frequency.  Musculoskeletal:  Positive for back pain. Negative for arthralgias and myalgias.  Neurological:  Negative for dizziness and headaches.  Psychiatric/Behavioral:  Negative for dysphoric mood.        No dysphoria     Current Outpatient Medications on File Prior to Visit  Medication Sig Dispense Refill   amitriptyline (ELAVIL) 25 MG tablet TAKE 1 TABLET BY MOUTH AT BEDTIME. 90 tablet 0   amLODipine (NORVASC) 10 MG tablet Take 1 tablet (10 mg total) by mouth daily. 90 tablet 0   Ascorbic Acid (VITAMIN C) 1000 MG tablet Take 1,000 mg by mouth daily.     atorvastatin (LIPITOR) 10 MG tablet TAKE 1 TABLET BY MOUTH DAILY. 90 tablet 0   buPROPion (WELLBUTRIN XL) 150 MG 24 hr tablet TAKE 1 TABLET BY MOUTH DAILY 90 tablet 0   clonazePAM (KLONOPIN) 0.5 MG tablet TAKE 1 TABLET BY MOUTH TWO TIMES DAILY. 60 tablet 1   esomeprazole (NEXIUM) 40 MG capsule TAKE 1 CAPSULE BY MOUTH 2 TIMES DAILY BEFORE A MEAL. 180 capsule 0   Eszopiclone 3 MG TABS TAKE 1 TABLET BY MOUTH EVERY DAY AT BEDTIME .  TAKE  IMMEDIATELY  BEFORE  BEDTIME 90 tablet 1   ezetimibe (ZETIA) 10 MG tablet TAKE 1 TABLET BY MOUTH DAILY 90 tablet 0   famotidine (PEPCID) 40 MG tablet Take 1 tablet (40 mg total) by mouth 2 (two) times daily. 180 tablet 3   fenofibrate (TRICOR) 145  MG tablet TAKE 1 TABLET BY MOUTH DAILY 90 tablet 0   Finerenone (KERENDIA) 10 MG TABS Take 1 tablet by mouth daily. (Patient not taking: Reported on 02/22/2022) 90 tablet 1   fluticasone (FLONASE) 50 MCG/ACT nasal spray Place 2 sprays into both nostrils daily. 16 g 6   furosemide (LASIX) 20 MG tablet TAKE 1 TABLET DAILY 90 tablet 3   HYDROcodone-acetaminophen (NORCO/VICODIN) 5-325 MG tablet Take 1 tablet by mouth every 6 (six) hours as needed for moderate pain (back pain). 40 tablet 0   levocetirizine (XYZAL) 5 MG tablet Take by mouth every evening.     levothyroxine (SYNTHROID) 50 MCG tablet TAKE 1 TABLET BY MOUTH DAILY. 90 tablet 0   lisinopril (ZESTRIL) 20 MG tablet TAKE 1 TABLET BY MOUTH DAILY 90 tablet 0   metFORMIN (GLUCOPHAGE) 500 MG tablet TAKE 1 TABLET BY MOUTH 2 TIMES DAILY. 180 tablet 0   methocarbamol (ROBAXIN) 750 MG tablet TAKE 1 TABLET BY MOUTH 4 TIMES DAILY. 360  tablet 0   metoprolol succinate (TOPROL-XL) 25 MG 24 hr tablet Take 1 tablet (25 mg total) by mouth daily. 90 tablet 3   montelukast (SINGULAIR) 10 MG tablet TAKE 1 TABLET BY MOUTH AT BEDTIME. 30 tablet 2   Multiple Vitamins-Minerals (CENTRUM SILVER 50+MEN PO) Take by mouth.     olopatadine (PATANOL) 0.1 % ophthalmic solution Place 1 drop into both eyes 2 (two) times daily. 5 mL 12   omega-3 acid ethyl esters (LOVAZA) 1 g capsule TAKE 2 CAPSULES BY MOUTH TWO TIMES DAILY. 360 capsule 0   pregabalin (LYRICA) 300 MG capsule TAKE 1 CAPSULE BY MOUTH TWICE A DAY 180 capsule 0   promethazine (PHENERGAN) 25 MG tablet TAKE 1 TABLET BY MOUTH EVERY 6 HOURS AS NEEDED FOR NAUSEA AND VOMITING 60 tablet 0   sucralfate (CARAFATE) 1 g tablet Take 1 tablet (1 g total) by mouth 4 (four) times daily -  with meals and at bedtime. 120 tablet 2   traZODone (DESYREL) 50 MG tablet TAKE 1 TO 2 TABLETS AT BEDTIME 180 tablet 2   valACYclovir (VALTREX) 1000 MG tablet TAKE 1 TABLET BY MOUTH TWICE DAILY FOR 7 DAYS AS NEEDED FOR COLD SORES 14 tablet 0   venlafaxine XR (EFFEXOR-XR) 150 MG 24 hr capsule TAKE 2 CAPSULES BY MOUTH DAILY WITH BREAKFAST. 180 capsule 0   vitamin B-12 (CYANOCOBALAMIN) 250 MCG tablet Take 250 mcg by mouth daily.     No current facility-administered medications on file prior to visit.   Past Medical History:  Diagnosis Date   Acute coronary syndrome (HCC) 02/04/2020   Anxiety    Arthritis    Cancer (HCC)    kidney cancer   Depression    Diabetes mellitus without complication (HCC) 2011   type II   Essential hypertension    GERD (gastroesophageal reflux disease)    History of kidney stones    Mixed hyperlipidemia    Past Surgical History:  Procedure Laterality Date   CHOLECYSTECTOMY  2008   PARTIAL NEPHRECTOMY  2002   kidney cancer   SHOULDER SURGERY  2008    Family History  Problem Relation Age of Onset   Drug abuse Sister    Aortic stenosis Brother        aortic valve replacement:  postop infection   Cancer Brother    Heart disease Other    Stroke Other    Hypertension Other    Diabetes Other    CAD Other    Social  History   Socioeconomic History   Marital status: Married    Spouse name: Not on file   Number of children: 2   Years of education: Not on file   Highest education level: 12th grade  Occupational History   Occupation: maintance  Tobacco Use   Smoking status: Never   Smokeless tobacco: Never  Substance and Sexual Activity   Alcohol use: Not Currently    Comment: has not drank since 2011   Drug use: No   Sexual activity: Not on file  Other Topics Concern   Not on file  Social History Narrative   Not on file   Social Determinants of Health   Financial Resource Strain: Low Risk  (03/07/2023)   Overall Financial Resource Strain (CARDIA)    Difficulty of Paying Living Expenses: Not hard at all  Food Insecurity: No Food Insecurity (03/07/2023)   Hunger Vital Sign    Worried About Running Out of Food in the Last Year: Never true    Ran Out of Food in the Last Year: Never true  Transportation Needs: No Transportation Needs (03/07/2023)   PRAPARE - Administrator, Civil Service (Medical): No    Lack of Transportation (Non-Medical): No  Physical Activity: Inactive (03/07/2023)   Exercise Vital Sign    Days of Exercise per Week: 0 days    Minutes of Exercise per Session: 0 min  Stress: No Stress Concern Present (03/07/2023)   [employer] of Occupational Health - Occupational Stress Questionnaire    Feeling of Stress : Not at all  Social Connections: Socially Integrated (03/07/2023)   Social Connection and Isolation Panel [NHANES]    Frequency of Communication with Friends and Family: Three times a week    Frequency of Social Gatherings with Friends and Family: Once a week    Attends Religious Services: 1 to 4 times per year    Active Member of [employer] or Organizations: No    Attends Engineer, structural: More than 4  times per year    Marital Status: Married    Objective:  BP (!) 106/58   Pulse 72   Temp (!) 97 F (36.1 C)   Resp 16   Ht 6' (1.829 m)   Wt 263 lb (119.3 kg)   BMI 35.67 kg/m      03/11/2023    7:48 AM 11/05/2022    7:29 AM 07/02/2022    7:35 AM  BP/Weight  Systolic BP 106 110 120  Diastolic BP 58 60 76  Wt. (Lbs) 263 260 257  BMI 35.67 kg/m2 35.26 kg/m2 34.86 kg/m2    Physical Exam  Diabetic Foot Exam - Simple   Simple Foot Form Visual Inspection See comments: Yes Sensation Testing Intact to touch and monofilament testing bilaterally: Yes Pulse Check Posterior Tibialis and Dorsalis pulse intact bilaterally: Yes Comments Thickened yellow nails       Lab Results  Component Value Date   WBC 5.0 12/20/2022   HGB 11.4 (L) 12/20/2022   HCT 35.9 (L) 12/20/2022   PLT 283 12/20/2022   GLUCOSE 109 (H) 11/05/2022   CHOL 150 11/05/2022   TRIG 181 (H) 11/05/2022   HDL 44 11/05/2022   LDLCALC 75 11/05/2022   ALT 49 (H) 11/05/2022   AST 48 (H) 11/05/2022   NA 140 11/05/2022   K 4.5 11/05/2022   CL 104 11/05/2022   CREATININE 1.39 (H) 11/05/2022   BUN 19 11/05/2022   CO2 22 11/05/2022   TSH 1.560  02/22/2022   INR 1.04 03/28/2014   HGBA1C 6.2 (H) 11/05/2022   MICROALBUR 30 12/27/2020      Assessment & Plan:    Type 2 diabetes mellitus with polyneuropathy (HCC) Assessment & Plan: Control: good Recommend check sugars fasting daily. Recommend check feet daily. Recommend annual eye exams. Medicines: Continue Metformin 500 mg twice a day. Continue to work on eating a healthy diet and exercise.  Labs drawn today.      Acquired hypothyroidism Assessment & Plan: Previously well controlled Continue Synthroid at current dose  Recheck TSH and adjust Synthroid as indicated     Mixed hyperlipidemia Assessment & Plan: Controlled Continue Fenofibrate 145 mg daily, Lovaza 1 g twice a day, Atorvastatin 10 mg daily, Zetia 10 mg daily. Continue to work on  eating a healthy diet and exercise.  Labs drawn today.     Hypertensive kidney disease with stage 3a chronic kidney disease (HCC) Assessment & Plan: Stable. No changes to medicines. Continue Amlodipine 10 mg  daily, Lisinopril 20 mg daily, Metoprolol 25 mg daily twice a day. Continue to work on eating a healthy diet and exercise.  Labs drawn today.       No orders of the defined types were placed in this encounter.   No orders of the defined types were placed in this encounter.    Follow-up: No follow-ups on file.   I,Marla I Leal-Borjas,acting as a scribe for Blane Ohara, MD.,have documented all relevant documentation on the behalf of Blane Ohara, MD,as directed by  Blane Ohara, MD while in the presence of Blane Ohara, MD.   An After Visit Summary was printed and given to the patient.  Blane Ohara, MD [facility] [phone]

## 2023-03-11 NOTE — Assessment & Plan Note (Signed)
Stable. No changes to medicines. Continue Amlodipine 10 mg  daily, Lisinopril 20 mg daily, Metoprolol 25 mg daily twice a day. Continue to work on eating a healthy diet and exercise.  Labs drawn today.

## 2023-03-11 NOTE — Assessment & Plan Note (Signed)
Previously well controlled Continue Synthroid at current dose  Recheck TSH and adjust Synthroid as indicated   

## 2023-03-11 NOTE — Assessment & Plan Note (Signed)
Controlled Continue Fenofibrate 145 mg daily, Lovaza 1 g twice a day, Atorvastatin 10 mg daily, Zetia 10 mg daily. Continue to work on eating a healthy diet and exercise.  Labs drawn today.

## 2023-03-11 NOTE — Assessment & Plan Note (Signed)
Control: good Recommend check sugars fasting daily. Recommend check feet daily. Recommend annual eye exams. Medicines: Continue Metformin 500 mg twice a day. Continue to work on eating a healthy diet and exercise.  Labs drawn today.    

## 2023-03-12 LAB — COMPREHENSIVE METABOLIC PANEL
ALT: 43 [IU]/L (ref 0–44)
AST: 53 [IU]/L — ABNORMAL HIGH (ref 0–40)
Albumin: 4.6 g/dL (ref 3.8–4.9)
Alkaline Phosphatase: 39 [IU]/L — ABNORMAL LOW (ref 44–121)
BUN/Creatinine Ratio: 13 (ref 10–24)
BUN: 19 mg/dL (ref 8–27)
Bilirubin Total: 0.2 mg/dL (ref 0.0–1.2)
CO2: 23 mmol/L (ref 20–29)
Calcium: 10 mg/dL (ref 8.6–10.2)
Chloride: 104 mmol/L (ref 96–106)
Creatinine, Ser: 1.41 mg/dL — ABNORMAL HIGH (ref 0.76–1.27)
Globulin, Total: 2.3 g/dL (ref 1.5–4.5)
Glucose: 108 mg/dL — ABNORMAL HIGH (ref 70–99)
Potassium: 4.8 mmol/L (ref 3.5–5.2)
Sodium: 143 mmol/L (ref 134–144)
Total Protein: 6.9 g/dL (ref 6.0–8.5)
eGFR: 57 mL/min/{1.73_m2} — ABNORMAL LOW (ref >59)

## 2023-03-12 LAB — CBC WITH DIFFERENTIAL/PLATELET
Basophils Absolute: 0 10*3/uL (ref 0.0–0.2)
Basos: 1 %
EOS (ABSOLUTE): 0.3 10*3/uL (ref 0.0–0.4)
Eos: 5 %
Hematocrit: 35.6 % — ABNORMAL LOW (ref 37.5–51.0)
Hemoglobin: 11.4 g/dL — ABNORMAL LOW (ref 13.0–17.7)
Immature Grans (Abs): 0 10*3/uL (ref 0.0–0.1)
Immature Granulocytes: 0 %
Lymphocytes Absolute: 1.1 10*3/uL (ref 0.7–3.1)
Lymphs: 22 %
MCH: 30.5 pg (ref 26.6–33.0)
MCHC: 32 g/dL (ref 31.5–35.7)
MCV: 95 fL (ref 79–97)
Monocytes Absolute: 0.4 10*3/uL (ref 0.1–0.9)
Monocytes: 8 %
Neutrophils Absolute: 3.4 10*3/uL (ref 1.4–7.0)
Neutrophils: 64 %
Platelets: 247 10*3/uL (ref 150–450)
RBC: 3.74 x10E6/uL — ABNORMAL LOW (ref 4.14–5.80)
RDW: 12.4 % (ref 11.6–15.4)
WBC: 5.2 10*3/uL (ref 3.4–10.8)

## 2023-03-12 LAB — LIPID PANEL
Chol/HDL Ratio: 3.8 {ratio} (ref 0.0–5.0)
Cholesterol, Total: 151 mg/dL (ref 100–199)
HDL: 40 mg/dL (ref >39)
LDL Chol Calc (NIH): 74 mg/dL (ref 0–99)
Triglycerides: 227 mg/dL — ABNORMAL HIGH (ref 0–149)
VLDL Cholesterol Cal: 37 mg/dL (ref 5–40)

## 2023-03-12 LAB — TSH: TSH: 1.92 u[IU]/mL (ref 0.450–4.500)

## 2023-03-12 LAB — HIV ANTIBODY (ROUTINE TESTING W REFLEX): HIV Screen 4th Generation wRfx: NONREACTIVE

## 2023-03-12 LAB — HCV AB W REFLEX TO QUANT PCR: HCV Ab: NONREACTIVE

## 2023-03-12 LAB — HEMOGLOBIN A1C
Est. average glucose Bld gHb Est-mCnc: 126 mg/dL
Hgb A1c MFr Bld: 6 % — ABNORMAL HIGH (ref 4.8–5.6)

## 2023-03-12 LAB — HCV INTERPRETATION

## 2023-03-12 LAB — LITHOLINK CKD PROGRAM

## 2023-03-12 NOTE — Assessment & Plan Note (Signed)
Order UA

## 2023-03-13 ENCOUNTER — Encounter: Payer: Self-pay | Admitting: Family Medicine

## 2023-03-13 DIAGNOSIS — Z23 Encounter for immunization: Secondary | ICD-10-CM | POA: Insufficient documentation

## 2023-03-13 HISTORY — DX: Encounter for immunization: Z23

## 2023-03-17 ENCOUNTER — Other Ambulatory Visit: Payer: Self-pay | Admitting: Family Medicine

## 2023-03-28 ENCOUNTER — Other Ambulatory Visit: Payer: Self-pay | Admitting: Family Medicine

## 2023-04-05 ENCOUNTER — Other Ambulatory Visit: Payer: Self-pay | Admitting: Family Medicine

## 2023-04-19 ENCOUNTER — Other Ambulatory Visit: Payer: Self-pay | Admitting: Family Medicine

## 2023-04-19 ENCOUNTER — Other Ambulatory Visit: Payer: Self-pay

## 2023-05-04 ENCOUNTER — Other Ambulatory Visit: Payer: Self-pay | Admitting: Family Medicine

## 2023-05-23 ENCOUNTER — Other Ambulatory Visit: Payer: Self-pay | Admitting: Family Medicine

## 2023-05-30 ENCOUNTER — Other Ambulatory Visit: Payer: Self-pay | Admitting: Family Medicine

## 2023-05-30 ENCOUNTER — Other Ambulatory Visit: Payer: Self-pay

## 2023-05-30 DIAGNOSIS — E782 Mixed hyperlipidemia: Secondary | ICD-10-CM

## 2023-07-02 ENCOUNTER — Other Ambulatory Visit: Payer: Self-pay | Admitting: Family Medicine

## 2023-07-18 ENCOUNTER — Other Ambulatory Visit: Payer: Self-pay | Admitting: Family Medicine

## 2023-07-22 ENCOUNTER — Ambulatory Visit: Payer: 59 | Admitting: Family Medicine

## 2023-07-22 ENCOUNTER — Encounter: Payer: Self-pay | Admitting: Family Medicine

## 2023-07-22 VITALS — BP 112/62 | HR 72 | Temp 97.8°F | Resp 16 | Ht 72.0 in | Wt 267.0 lb

## 2023-07-22 DIAGNOSIS — K219 Gastro-esophageal reflux disease without esophagitis: Secondary | ICD-10-CM

## 2023-07-22 DIAGNOSIS — E782 Mixed hyperlipidemia: Secondary | ICD-10-CM | POA: Diagnosis not present

## 2023-07-22 DIAGNOSIS — E66812 Obesity, class 2: Secondary | ICD-10-CM | POA: Diagnosis not present

## 2023-07-22 DIAGNOSIS — E1142 Type 2 diabetes mellitus with diabetic polyneuropathy: Secondary | ICD-10-CM | POA: Diagnosis not present

## 2023-07-22 DIAGNOSIS — Z23 Encounter for immunization: Secondary | ICD-10-CM

## 2023-07-22 DIAGNOSIS — Z6836 Body mass index (BMI) 36.0-36.9, adult: Secondary | ICD-10-CM

## 2023-07-22 DIAGNOSIS — F330 Major depressive disorder, recurrent, mild: Secondary | ICD-10-CM | POA: Diagnosis not present

## 2023-07-22 DIAGNOSIS — I129 Hypertensive chronic kidney disease with stage 1 through stage 4 chronic kidney disease, or unspecified chronic kidney disease: Secondary | ICD-10-CM

## 2023-07-22 DIAGNOSIS — N1831 Chronic kidney disease, stage 3a: Secondary | ICD-10-CM

## 2023-07-22 DIAGNOSIS — E039 Hypothyroidism, unspecified: Secondary | ICD-10-CM

## 2023-07-22 NOTE — Assessment & Plan Note (Signed)
 Control: good Recommend check sugars fasting daily. Recommend check feet daily. Recommend annual eye exams. Medicines: Continue Metformin  500 mg twice a day and lyrica  300 mg twice daily. Continue to work on eating a healthy diet and exercise.  Labs drawn today.

## 2023-07-22 NOTE — Assessment & Plan Note (Signed)
 The current medical regimen is effective;  continue present plan and medications. Continue Esomeprazole  40 mg daily, Famotidine  40 mg twice daily

## 2023-07-22 NOTE — Progress Notes (Signed)
 Subjective:  Patient ID: Frederick Martinez, male    DOB: 1960/09/06  Age: 63 y.o. MRN: [id number]  Chief Complaint  Patient presents with   Medical Management of Chronic Issues    HPI   Diabetes:  Complications: hyperlipidemia, hypertension Glucose checking: infrequent. Only when feels bad.  Most recent A1C: 6.0% Current medications: Metformin  500 mg twice a day. Lyrica  300 mg twice daily.  Last Eye Exam: 02/17/2022 Foot checks: daily  CKD 3A: No kerendia  per nephrologist. Discontinued meloxicam .   Depression: Effexor  150 mg daily, Trazodone  50 mg at bedtime, Amitriptyline  25 mg daily, Wellbutrin  150 mg daily. Doing well. On clonazepam  0.5 mg twice daily. On lunesta  3 mg before bed on insomnia.  Hypertension: On lisinopril  20 mg daily.  Hyperlipidemia: On lipitor 10 mg before bed, zetia  10 mg daily, fenofibrate  `145 mg daily,   Hypothyroidism: levothyroxine  50 mcg once in am.   Chronic back pain: on vicodin one four times a day as needed - takes 1-2 daily usually. , robaxin  750 mg four times a day.  GERD: On nexium  40 mg once daily and pepcid  40 mg twice daily.       03/11/2023    7:54 AM 11/05/2022    7:39 AM 11/05/2022    7:34 AM 07/02/2022    7:38 AM 02/22/2022    8:09 AM  Depression screen PHQ 2/9  Decreased Interest 0 0 0 0 0  Down, Depressed, Hopeless 0 0 0 0 0  PHQ - 2 Score 0 0 0 0 0  Altered sleeping     0  Tired, decreased energy     0  Change in appetite     1  Feeling bad or failure about yourself      0  PHQ-9 Score     1        03/11/2023    7:54 AM  Fall Risk   Falls in the past year? 0  Number falls in past yr: 0  Injury with Fall? 0  Risk for fall due to : No Fall Risks    Patient Care Team: Sherre Clapper, MD as PCP - General (Family Medicine)   Review of Systems  Constitutional:  Negative for chills, fatigue and fever.  HENT:  Negative for congestion, ear pain and sore throat.   Respiratory:  Negative for cough and shortness of breath.    Cardiovascular:  Negative for chest pain.  Gastrointestinal:  Negative for abdominal pain, constipation, diarrhea, nausea and vomiting.  Endocrine: Negative for polydipsia, polyphagia and polyuria.  Genitourinary:  Negative for dysuria and frequency.  Musculoskeletal:  Positive for arthralgias (left knee) and back pain. Negative for myalgias.  Neurological:  Positive for headaches. Negative for dizziness.  Psychiatric/Behavioral:  Negative for dysphoric mood.        No dysphoria    Current Outpatient Medications on File Prior to Visit  Medication Sig Dispense Refill   amitriptyline  (ELAVIL ) 25 MG tablet TAKE 1 TABLET BY MOUTH AT BEDTIME. 90 tablet 1   Ascorbic Acid (VITAMIN C) 1000 MG tablet Take 1,000 mg by mouth daily.     atorvastatin  (LIPITOR) 10 MG tablet TAKE 1 TABLET BY MOUTH DAILY. 90 tablet 0   buPROPion  (WELLBUTRIN  XL) 150 MG 24 hr tablet TAKE 1 TABLET BY MOUTH DAILY 90 tablet 1   clonazePAM  (KLONOPIN ) 0.5 MG tablet TAKE 1 TABLET BY MOUTH TWICE DAILY 60 tablet 5   esomeprazole  (NEXIUM ) 40 MG capsule TAKE 1 CAPSULE BY MOUTH 2 TIMES DAILY  BEFORE A MEAL 180 capsule 0   Eszopiclone  3 MG TABS TAKE 1 TABLET BY MOUTH EVERY DAY AT BEDTIME .  TAKE  IMMEDIATELY  BEFORE  BEDTIME 90 tablet 1   ezetimibe  (ZETIA ) 10 MG tablet TAKE 1 TABLET BY MOUTH DAILY 90 tablet 1   famotidine  (PEPCID ) 40 MG tablet TAKE 1 TABLET BY MOUTH 2 TIMES DAILY. 180 tablet 2   fenofibrate  (TRICOR ) 145 MG tablet TAKE 1 TABLET BY MOUTH DAILY 90 tablet 0   HYDROcodone -acetaminophen  (NORCO/VICODIN) 5-325 MG tablet Take 1 tablet by mouth every 6 (six) hours as needed for moderate pain (pain score 4-6) (back pain). 40 tablet 0   levothyroxine  (SYNTHROID ) 50 MCG tablet TAKE 1 TABLET BY MOUTH DAILY 90 tablet 1   lisinopril  (ZESTRIL ) 20 MG tablet TAKE 1 TABLET BY MOUTH DAILY 90 tablet 0   metFORMIN  (GLUCOPHAGE ) 500 MG tablet TAKE 1 TABLET BY MOUTH 2 TIMES DAILY. 180 tablet 0   methocarbamol  (ROBAXIN ) 750 MG tablet TAKE 1  TABLET BY MOUTH FOUR TIMES DAILY. 360 tablet 0   montelukast  (SINGULAIR ) 10 MG tablet TAKE 1 TABLET BY MOUTH AT BEDTIME. 30 tablet 1   Multiple Vitamins-Minerals (CENTRUM SILVER 50+MEN PO) Take by mouth.     omega-3 acid ethyl esters (LOVAZA ) 1 g capsule TAKE 2 CAPSULES BY MOUTH TWO TIMES DAILY 360 capsule 1   pregabalin  (LYRICA ) 300 MG capsule TAKE 1 CAPSULE BY MOUTH TWICE A DAY 180 capsule 1   promethazine (PHENERGAN) 25 MG tablet TAKE 1 TABLET BY MOUTH EVERY 6 HOURS AS NEEDED FOR NAUSEA AND VOMITING 60 tablet 0   traZODone  (DESYREL ) 50 MG tablet TAKE 1 TO 2 TABLETS AT BEDTIME 180 tablet 1   valACYclovir (VALTREX) 1000 MG tablet TAKE 1 TABLET BY MOUTH TWICE DAILY FOR 7 DAYS AS NEEDED FOR COLD SORES 14 tablet 0   venlafaxine  XR (EFFEXOR -XR) 150 MG 24 hr capsule TAKE 2 CAPSULES BY MOUTH DAILY WITH BREAKFAST. 180 capsule 0   vitamin B-12 (CYANOCOBALAMIN ) 250 MCG tablet Take 250 mcg by mouth daily.     amLODipine  (NORVASC ) 10 MG tablet Take 1 tablet (10 mg total) by mouth daily. (Patient not taking: Reported on 07/22/2023) 90 tablet 0   No current facility-administered medications on file prior to visit.   Past Medical History:  Diagnosis Date   Acute coronary syndrome (HCC) 02/04/2020   Anxiety    Arthritis    Cancer (HCC)    kidney cancer   Depression    Diabetes mellitus without complication (HCC) 2011   type II   Essential hypertension    GERD (gastroesophageal reflux disease)    History of kidney stones    Mixed hyperlipidemia    Past Surgical History:  Procedure Laterality Date   CHOLECYSTECTOMY  2008   PARTIAL NEPHRECTOMY  2002   kidney cancer   SHOULDER SURGERY  2008    Family History  Problem Relation Age of Onset   Drug abuse Sister    Aortic stenosis Brother        aortic valve replacement: postop infection   Cancer Brother    Heart disease Other    Stroke Other    Hypertension Other    Diabetes Other    CAD Other    Social History   Socioeconomic History    Marital status: Married    Spouse name: Not on file   Number of children: 2   Years of education: Not on file   Highest education level: 12th grade  Occupational History   Occupation: maintance  Tobacco Use   Smoking status: Never   Smokeless tobacco: Never  Substance and Sexual Activity   Alcohol use: Not Currently    Comment: has not drank since 2011   Drug use: No   Sexual activity: Not on file  Other Topics Concern   Not on file  Social History Narrative   Not on file   Social Drivers of Health   Financial Resource Strain: Low Risk  (07/21/2023)   Overall Financial Resource Strain (CARDIA)    Difficulty of Paying Living Expenses: Not hard at all  Food Insecurity: No Food Insecurity (07/21/2023)   Hunger Vital Sign    Worried About Running Out of Food in the Last Year: Never true    Ran Out of Food in the Last Year: Never true  Transportation Needs: No Transportation Needs (07/21/2023)   PRAPARE - Administrator, Civil Service (Medical): No    Lack of Transportation (Non-Medical): No  Physical Activity: Unknown (07/21/2023)   Exercise Vital Sign    Days of Exercise per Week: Patient declined    Minutes of Exercise per Session: 0 min  Stress: Patient Declined (07/21/2023)   [employer] of Occupational Health - Occupational Stress Questionnaire    Feeling of Stress : Patient declined  Social Connections: Unknown (07/21/2023)   Social Connection and Isolation Panel [NHANES]    Frequency of Communication with Friends and Family: Patient declined    Frequency of Social Gatherings with Friends and Family: Patient declined    Attends Religious Services: Patient declined    Database Administrator or Organizations: Patient declined    Attends Engineer, Structural: More than 4 times per year    Marital Status: Married    Objective:  BP 112/62 (BP Location: Right Arm, Patient Position: Sitting, Cuff Size: Large)   Pulse 72   Temp 97.8 F (36.6 C) (Temporal)    Resp 16   Ht 6' (1.829 m)   Wt 267 lb (121.1 kg)   SpO2 94%   BMI 36.21 kg/m      07/22/2023    7:29 AM 03/11/2023    7:48 AM 11/05/2022    7:29 AM  BP/Weight  Systolic BP 112 106 110  Diastolic BP 62 58 60  Wt. (Lbs) 267 263 260  BMI 36.21 kg/m2 35.67 kg/m2 35.26 kg/m2    Physical Exam Vitals reviewed.  Constitutional:      Appearance: Normal appearance. He is obese.  Neck:     Vascular: No carotid bruit.  Cardiovascular:     Rate and Rhythm: Normal rate and regular rhythm.     Pulses: Normal pulses.     Heart sounds: Normal heart sounds.  Pulmonary:     Effort: Pulmonary effort is normal.     Breath sounds: Normal breath sounds. No wheezing, rhonchi or rales.  Abdominal:     General: Bowel sounds are normal.     Palpations: Abdomen is soft.     Tenderness: There is no abdominal tenderness.  Neurological:     Mental Status: He is alert.  Psychiatric:        Mood and Affect: Mood normal.        Behavior: Behavior normal.     Diabetic Foot Exam - Simple   Simple Foot Form Diabetic Foot exam was performed with the following findings: Yes 07/22/2023  8:04 AM  Visual Inspection No deformities, no ulcerations, no other skin breakdown bilaterally: Yes  Sensation Testing Intact to touch and monofilament testing bilaterally: Yes Pulse Check Posterior Tibialis and Dorsalis pulse intact bilaterally: Yes Comments      Lab Results  Component Value Date   WBC 5.2 03/11/2023   HGB 11.4 (L) 03/11/2023   HCT 35.6 (L) 03/11/2023   PLT 247 03/11/2023   GLUCOSE 108 (H) 03/11/2023   CHOL 151 03/11/2023   TRIG 227 (H) 03/11/2023   HDL 40 03/11/2023   LDLCALC 74 03/11/2023   ALT 43 03/11/2023   AST 53 (H) 03/11/2023   NA 143 03/11/2023   K 4.8 03/11/2023   CL 104 03/11/2023   CREATININE 1.41 (H) 03/11/2023   BUN 19 03/11/2023   CO2 23 03/11/2023   TSH 1.920 03/11/2023   INR 1.04 03/28/2014   HGBA1C 6.0 (H) 03/11/2023   MICROALBUR 30 12/27/2020      Assessment  & Plan:    Type 2 diabetes mellitus with polyneuropathy (HCC) Assessment & Plan: Control: good Recommend check sugars fasting daily. Recommend check feet daily. Recommend annual eye exams. Medicines: Continue Metformin  500 mg twice a day and lyrica  300 mg twice daily. Continue to work on eating a healthy diet and exercise.  Labs drawn today.     Orders: -     Hemoglobin A1c -     Microalbumin / creatinine urine ratio  Acquired hypothyroidism Assessment & Plan: Previously well controlled Continue Synthroid  at current dose    Mixed hyperlipidemia Assessment & Plan: Controlled Continue Fenofibrate  145 mg daily, Lovaza  1 g twice a day, Atorvastatin  10 mg daily, Zetia  10 mg daily. Continue to work on eating a healthy diet and exercise.  Labs drawn today.    Orders: -     Lipid panel  Hypertensive kidney disease with stage 3a chronic kidney disease (HCC) Assessment & Plan: Stable. No changes to medicines. Continue Lisinopril  20 mg daily. Continue to work on eating a healthy diet and exercise.  Labs drawn today.   Orders: -     CBC with Differential/Platelet -     Comprehensive metabolic panel  Gastroesophageal reflux disease without esophagitis Assessment & Plan: The current medical regimen is effective;  continue present plan and medications. Continue Esomeprazole  40 mg daily, Famotidine  40 mg twice daily   Mild recurrent major depression (HCC) Assessment & Plan: The current medical regimen is effective;  continue present plan and medications. Continue Effexor  150 mg daily, Trazodone  50 mg at bedtime, Amitriptyline  25 mg daily, Wellbutrin  150 mg daily.    Encounter for immunization -     Pneumococcal conjugate vaccine 20-valent  Class 2 severe obesity with serious comorbidity and body mass index (BMI) of 36.0 to 36.9 in adult, unspecified obesity type [facility]) Assessment & Plan: Comorbidities: diabetes, hyperlipidemia.  Recommend continue to work on eating  healthy diet and exercise.       No orders of the defined types were placed in this encounter.   Orders Placed This Encounter  Procedures   Pneumococcal conjugate vaccine 20-valent   CBC with Differential/Platelet   Comprehensive metabolic panel   Hemoglobin A1c   Lipid panel   Microalbumin / creatinine urine ratio     Follow-up: Return in about 4 months (around 11/19/2023) for chronic fasting.   I,Marla I Leal-Borjas,acting as a scribe for Abigail Free, MD.,have documented all relevant documentation on the behalf of Abigail Free, MD,as directed by  Abigail Free, MD while in the presence of Abigail Free, MD.   An After Visit Summary was printed and given  to the patient.  I attest that I have reviewed this visit and agree with the plan scribed by my staff.   Abigail Free, MD [facility] [phone]

## 2023-07-22 NOTE — Assessment & Plan Note (Signed)
Controlled Continue Fenofibrate 145 mg daily, Lovaza 1 g twice a day, Atorvastatin 10 mg daily, Zetia 10 mg daily. Continue to work on eating a healthy diet and exercise.  Labs drawn today.

## 2023-07-22 NOTE — Assessment & Plan Note (Signed)
Comorbidities: diabetes, hyperlipidemia.  Recommend continue to work on eating healthy diet and exercise.  

## 2023-07-22 NOTE — Assessment & Plan Note (Signed)
 Stable. No changes to medicines. Continue Lisinopril  20 mg daily. Continue to work on eating a healthy diet and exercise.  Labs drawn today.

## 2023-07-22 NOTE — Assessment & Plan Note (Signed)
The current medical regimen is effective;  continue present plan and medications. Continue Effexor 150 mg daily, Trazodone 50 mg at bedtime, Amitriptyline 25 mg daily, Wellbutrin 150 mg daily.

## 2023-07-22 NOTE — Assessment & Plan Note (Signed)
 Previously well controlled Continue Synthroid at current dose

## 2023-07-23 LAB — HEMOGLOBIN A1C
Est. average glucose Bld gHb Est-mCnc: 131 mg/dL
Hgb A1c MFr Bld: 6.2 % — ABNORMAL HIGH (ref 4.8–5.6)

## 2023-07-23 LAB — CBC WITH DIFFERENTIAL/PLATELET
Basophils Absolute: 0.1 10*3/uL (ref 0.0–0.2)
Basos: 1 %
EOS (ABSOLUTE): 0.3 10*3/uL (ref 0.0–0.4)
Eos: 5 %
Hematocrit: 36.2 % — ABNORMAL LOW (ref 37.5–51.0)
Hemoglobin: 11.7 g/dL — ABNORMAL LOW (ref 13.0–17.7)
Immature Grans (Abs): 0 10*3/uL (ref 0.0–0.1)
Immature Granulocytes: 0 %
Lymphocytes Absolute: 1.2 10*3/uL (ref 0.7–3.1)
Lymphs: 25 %
MCH: 30.8 pg (ref 26.6–33.0)
MCHC: 32.3 g/dL (ref 31.5–35.7)
MCV: 95 fL (ref 79–97)
Monocytes Absolute: 0.4 10*3/uL (ref 0.1–0.9)
Monocytes: 8 %
Neutrophils Absolute: 2.9 10*3/uL (ref 1.4–7.0)
Neutrophils: 61 %
Platelets: 259 10*3/uL (ref 150–450)
RBC: 3.8 x10E6/uL — ABNORMAL LOW (ref 4.14–5.80)
RDW: 12.7 % (ref 11.6–15.4)
WBC: 4.8 10*3/uL (ref 3.4–10.8)

## 2023-07-23 LAB — COMPREHENSIVE METABOLIC PANEL
ALT: 50 [IU]/L — ABNORMAL HIGH (ref 0–44)
AST: 52 [IU]/L — ABNORMAL HIGH (ref 0–40)
Albumin: 4.5 g/dL (ref 3.8–4.9)
Alkaline Phosphatase: 46 [IU]/L (ref 44–121)
BUN/Creatinine Ratio: 12 (ref 10–24)
BUN: 16 mg/dL (ref 8–27)
Bilirubin Total: 0.2 mg/dL (ref 0.0–1.2)
CO2: 24 mmol/L (ref 20–29)
Calcium: 10.3 mg/dL — ABNORMAL HIGH (ref 8.6–10.2)
Chloride: 104 mmol/L (ref 96–106)
Creatinine, Ser: 1.39 mg/dL — ABNORMAL HIGH (ref 0.76–1.27)
Globulin, Total: 2.3 g/dL (ref 1.5–4.5)
Glucose: 96 mg/dL (ref 70–99)
Potassium: 4.6 mmol/L (ref 3.5–5.2)
Sodium: 143 mmol/L (ref 134–144)
Total Protein: 6.8 g/dL (ref 6.0–8.5)
eGFR: 58 mL/min/{1.73_m2} — ABNORMAL LOW (ref >59)

## 2023-07-23 LAB — LIPID PANEL
Chol/HDL Ratio: 3.8 {ratio} (ref 0.0–5.0)
Cholesterol, Total: 161 mg/dL (ref 100–199)
HDL: 42 mg/dL (ref >39)
LDL Chol Calc (NIH): 84 mg/dL (ref 0–99)
Triglycerides: 206 mg/dL — ABNORMAL HIGH (ref 0–149)
VLDL Cholesterol Cal: 35 mg/dL (ref 5–40)

## 2023-07-23 LAB — MICROALBUMIN / CREATININE URINE RATIO
Creatinine, Urine: 125.6 mg/dL
Microalb/Creat Ratio: 5 mg/g{creat} (ref 0–29)
Microalbumin, Urine: 6 ug/mL

## 2023-07-24 ENCOUNTER — Encounter: Payer: Self-pay | Admitting: Family Medicine

## 2023-07-25 ENCOUNTER — Other Ambulatory Visit: Payer: Self-pay

## 2023-07-25 MED ORDER — OZEMPIC (0.25 OR 0.5 MG/DOSE) 2 MG/3ML ~~LOC~~ SOPN: 0.2500 mg | PEN_INJECTOR | SUBCUTANEOUS | 0 refills | Status: DC

## 2023-07-29 ENCOUNTER — Other Ambulatory Visit: Payer: Self-pay | Admitting: Family Medicine

## 2023-08-10 ENCOUNTER — Telehealth: Payer: Self-pay | Admitting: Family Medicine

## 2023-08-10 NOTE — Telephone Encounter (Unsigned)
 Copied from CRM [id number]. Topic: Clinical - Medication Question >> Aug 10, 2023  2:02 PM Gery Pray wrote: Reason for CRM: Wife Clydie Braun called in regards to Semaglutide,0.25 or 0.5MG /DOS, (OZEMPIC, 0.25 OR 0.5 MG/DOSE,) 2 MG/3ML SOPN. Wife states that the insurance will not cover the medication. However, they have provided medications that they do cover. The medications are Liraglutide. Will like to know if this would be a good replacement for the patient to take in place of the Semaglutide,0.25 or 0.5MG /DOS, (OZEMPIC, 0.25 OR 0.5 MG/DOSE,) 2 MG/3ML SOPN. Callback number for wife is [phone] in regards to the medication replacement

## 2023-08-11 ENCOUNTER — Other Ambulatory Visit: Payer: Self-pay

## 2023-08-11 MED ORDER — LIRAGLUTIDE 18 MG/3ML ~~LOC~~ SOPN: PEN_INJECTOR | SUBCUTANEOUS | 0 refills | Status: DC

## 2023-08-11 NOTE — Telephone Encounter (Signed)
 Ordered victoza 0.6 mg daily x 1 week, then 1.2 mg daily x 1 week, then 1.8 mg daily.

## 2023-08-11 NOTE — Telephone Encounter (Signed)
 Nurse discussed with his wife, Clydie Braun

## 2023-08-18 ENCOUNTER — Other Ambulatory Visit: Payer: Self-pay | Admitting: Family Medicine

## 2023-08-28 ENCOUNTER — Other Ambulatory Visit: Payer: Self-pay | Admitting: Family Medicine

## 2023-08-29 ENCOUNTER — Other Ambulatory Visit: Payer: Self-pay | Admitting: Family Medicine

## 2023-08-29 DIAGNOSIS — E782 Mixed hyperlipidemia: Secondary | ICD-10-CM

## 2023-08-30 ENCOUNTER — Other Ambulatory Visit: Payer: Self-pay | Admitting: Family Medicine

## 2023-09-03 DIAGNOSIS — R109 Unspecified abdominal pain: Secondary | ICD-10-CM | POA: Diagnosis not present

## 2023-09-03 DIAGNOSIS — S301XXA Contusion of abdominal wall, initial encounter: Secondary | ICD-10-CM | POA: Diagnosis not present

## 2023-09-03 DIAGNOSIS — W5522XA Struck by cow, initial encounter: Secondary | ICD-10-CM | POA: Diagnosis not present

## 2023-09-06 ENCOUNTER — Telehealth: Payer: Self-pay

## 2023-09-06 NOTE — Transitions of Care (Post Inpatient/ED Visit) (Signed)
 09/06/2023  Name: Frederick Martinez MRN: [id number] DOB: 20-Feb-1961  Today's TOC FU Call Status: Today's TOC FU Call Status:: Successful TOC FU Call Completed TOC FU Call Complete Date: 09/06/23 Patient's Name and Date of Birth confirmed.  Transition Care Management Follow-up Telephone Call Date of Discharge: 09/03/23 Discharge Facility: Other ([facility]) Name of Other ([facility]) Discharge Facility: [facility] Type of Discharge: Emergency Department Reason for ED Visit: Other: (contusion) How have you been since you were released from the hospital?: Better Any questions or concerns?: No  Items Reviewed: Did you receive and understand the discharge instructions provided?: Yes Medications obtained,verified, and reconciled?: Yes (Medications Reviewed) Any new allergies since your discharge?: No Dietary orders reviewed?: Yes Do you have support at home?: Yes People in Home: spouse  Medications Reviewed Today: Medications Reviewed Today     Reviewed by Karena Addison, LPN (Licensed Practical Nurse) on 09/06/23 at 1645  Med List Status: <None>   Medication Order Taking? Sig Documenting Provider Last Dose Status Informant  amitriptyline (ELAVIL) 25 MG tablet [id number] No TAKE 1 TABLET BY MOUTH AT BEDTIME. Cox, Kirsten, MD Taking Active   amLODipine (NORVASC) 10 MG tablet [id number] No Take 1 tablet (10 mg total) by mouth daily.  Patient not taking: Reported on 07/22/2023   CoxFritzi Mandes, MD Not Taking Active   Ascorbic Acid (VITAMIN C) 1000 MG tablet [id number] No Take 1,000 mg by mouth daily. [provider] Taking Active   atorvastatin (LIPITOR) 10 MG tablet [id number]  TAKE 1 TABLET BY MOUTH DAILY. Cox, Kirsten, MD  Active   buPROPion (WELLBUTRIN XL) 150 MG 24 hr tablet [id number] No TAKE 1 TABLET BY MOUTH DAILY Cox, Kirsten, MD Taking Active   clonazePAM (KLONOPIN) 0.5 MG tablet [id number] No TAKE 1 TABLET BY MOUTH TWICE DAILY Cox, Kirsten, MD Taking Active   esomeprazole  (NEXIUM) 40 MG capsule [id number]  TAKE 1 CAPSULE BY MOUTH TWO TIMES DAILY BEFORE A MEAL Cox, Kirsten, MD  Active   Eszopiclone 3 MG TABS [id number]  TAKE 1 TABLET BY MOUTH ONCE DAILY AT BEDTIME -  TAKE  IMMEDIATELY  BEFORE  BEDTIME Sirivol, Kristie Cowman, MD  Active   ezetimibe (ZETIA) 10 MG tablet [id number] No TAKE 1 TABLET BY MOUTH DAILY Cox, Kirsten, MD Taking Active   famotidine (PEPCID) 40 MG tablet [id number] No TAKE 1 TABLET BY MOUTH 2 TIMES DAILY. Cox, Kirsten, MD Taking Active   fenofibrate (TRICOR) 145 MG tablet [id number]  TAKE 1 TABLET BY MOUTH DAILY Cox, Kirsten, MD  Active   HYDROcodone-acetaminophen (NORCO/VICODIN) 5-325 MG tablet [id number]  Take 1 tablet by mouth every 6 (six) hours as needed for moderate pain (pain score 4-6) (back pain). Cox, Kirsten, MD  Active   levothyroxine (SYNTHROID) 50 MCG tablet [id number] No TAKE 1 TABLET BY MOUTH DAILY Cox, Kirsten, MD Taking Active   liraglutide (VICTOZA) 18 MG/3ML SOPN [id number]  Start 0.6mg  SQ once a day for 7 days, then increase to 1.2mg  once a day Cox, Kirsten, MD  Active   lisinopril (ZESTRIL) 20 MG tablet [id number]  TAKE 1 TABLET BY MOUTH DAILY Cox, Kirsten, MD  Active   metFORMIN (GLUCOPHAGE) 500 MG tablet [id number]  TAKE 1 TABLET BY MOUTH TWO TIMES DAILY. Cox, Kirsten, MD  Active   methocarbamol (ROBAXIN) 750 MG tablet [id number]  TAKE 1 TABLET BY MOUTH FOUR TIMES DAILY. Cox, Kirsten, MD  Active   montelukast (SINGULAIR) 10 MG tablet [id number]  TAKE 1 TABLET BY MOUTH AT BEDTIME. Blane Ohara, MD  Active  Multiple Vitamins-Minerals (CENTRUM SILVER 50+MEN PO) [id number] No Take by mouth. [provider] Taking Active   omega-3 acid ethyl esters (LOVAZA) 1 g capsule [id number] No TAKE 2 CAPSULES BY MOUTH TWO TIMES DAILY Cox, Kirsten, MD Taking Active   pregabalin (LYRICA) 300 MG capsule [id number] No TAKE 1 CAPSULE BY MOUTH TWICE A DAY Cox, Kirsten, MD Taking Active   promethazine (PHENERGAN) 25 MG tablet [id number]  TAKE 1 TABLET BY MOUTH  EVERY 6 HOURS AS NEEDED FOR NAUSEA AND VOMITING Cox, Kirsten, MD  Active   Semaglutide,0.25 or 0.5MG /DOS, (OZEMPIC, 0.25 OR 0.5 MG/DOSE,) 2 MG/3ML SOPN [id number]  Inject 0.25 mg into the skin once a week. Cox, Kirsten, MD  Active   traZODone (DESYREL) 50 MG tablet [id number] No TAKE 1 TO 2 TABLETS AT BEDTIME Cox, Kirsten, MD Taking Active   valACYclovir (VALTREX) 1000 MG tablet [id number] No TAKE 1 TABLET BY MOUTH TWICE DAILY FOR 7 DAYS AS NEEDED FOR COLD SORES Cox, Kirsten, MD Taking Active   venlafaxine XR (EFFEXOR-XR) 150 MG 24 hr capsule [id number]  TAKE 2 CAPSULES BY MOUTH DAILY WITH BREAKFAST. Cox, Kirsten, MD  Active   vitamin B-12 (CYANOCOBALAMIN) 250 MCG tablet [id number] No Take 250 mcg by mouth daily. [provider] Taking Active             Home Care and Equipment/Supplies: Were Home Health Services Ordered?: NA Any new equipment or medical supplies ordered?: NA  Functional Questionnaire: Do you need assistance with bathing/showering or dressing?: No Do you need assistance with meal preparation?: No Do you need assistance with eating?: No Do you have difficulty maintaining continence: No Do you need assistance with getting out of bed/getting out of a chair/moving?: No Do you have difficulty managing or taking your medications?: No  Follow up appointments reviewed: PCP Follow-up appointment confirmed?: No (declined) MD Provider Line Number:[phone] Given: No Specialist Hospital Follow-up appointment confirmed?: NA Do you need transportation to your follow-up appointment?: No Do you understand care options if your condition(s) worsen?: Yes-patient verbalized understanding    SIGNATURE Karena Addison, LPN [facility] Nurse Health Advisor Direct Dial [phone]

## 2023-09-13 DIAGNOSIS — R1032 Left lower quadrant pain: Secondary | ICD-10-CM | POA: Diagnosis not present

## 2023-09-13 DIAGNOSIS — B999 Unspecified infectious disease: Secondary | ICD-10-CM | POA: Diagnosis not present

## 2023-09-14 ENCOUNTER — Telehealth: Payer: Self-pay

## 2023-09-14 NOTE — Transitions of Care (Post Inpatient/ED Visit) (Signed)
 09/14/2023  Name: Frederick Martinez MRN: [id number] DOB: 1961-01-02  Today's TOC FU Call Status: Today's TOC FU Call Status:: Successful TOC FU Call Completed TOC FU Call Complete Date: 09/14/23 Patient's Name and Date of Birth confirmed.  Transition Care Management Follow-up Telephone Call Date of Discharge: 09/13/23 Discharge Facility: Other ([facility]) Name of Other ([facility]) Discharge Facility: [facility] Type of Discharge: Emergency Department Reason for ED Visit: Other: (abd pain) How have you been since you were released from the hospital?: Better Any questions or concerns?: No  Items Reviewed: Did you receive and understand the discharge instructions provided?: Yes Medications obtained,verified, and reconciled?: Yes (Medications Reviewed) Any new allergies since your discharge?: No Dietary orders reviewed?: Yes Do you have support at home?: Yes People in Home: spouse  Medications Reviewed Today: Medications Reviewed Today     Reviewed by Karena Addison, LPN (Licensed Practical Nurse) on 09/14/23 at 1044  Med List Status: <None>   Medication Order Taking? Sig Documenting Provider Last Dose Status Informant  amitriptyline (ELAVIL) 25 MG tablet [id number] No TAKE 1 TABLET BY MOUTH AT BEDTIME. Cox, Kirsten, MD Taking Active   amLODipine (NORVASC) 10 MG tablet [id number] No Take 1 tablet (10 mg total) by mouth daily.  Patient not taking: Reported on 07/22/2023   CoxFritzi Mandes, MD Not Taking Active   Ascorbic Acid (VITAMIN C) 1000 MG tablet [id number] No Take 1,000 mg by mouth daily. [provider] Taking Active   atorvastatin (LIPITOR) 10 MG tablet [id number]  TAKE 1 TABLET BY MOUTH DAILY. Cox, Kirsten, MD  Active   buPROPion (WELLBUTRIN XL) 150 MG 24 hr tablet [id number] No TAKE 1 TABLET BY MOUTH DAILY Cox, Kirsten, MD Taking Active   clonazePAM (KLONOPIN) 0.5 MG tablet [id number] No TAKE 1 TABLET BY MOUTH TWICE DAILY Cox, Kirsten, MD Taking Active   esomeprazole  (NEXIUM) 40 MG capsule [id number]  TAKE 1 CAPSULE BY MOUTH TWO TIMES DAILY BEFORE A MEAL Cox, Kirsten, MD  Active   Eszopiclone 3 MG TABS [id number]  TAKE 1 TABLET BY MOUTH ONCE DAILY AT BEDTIME -  TAKE  IMMEDIATELY  BEFORE  BEDTIME Sirivol, Kristie Cowman, MD  Active   ezetimibe (ZETIA) 10 MG tablet [id number] No TAKE 1 TABLET BY MOUTH DAILY Cox, Kirsten, MD Taking Active   famotidine (PEPCID) 40 MG tablet [id number] No TAKE 1 TABLET BY MOUTH 2 TIMES DAILY. Cox, Kirsten, MD Taking Active   fenofibrate (TRICOR) 145 MG tablet [id number]  TAKE 1 TABLET BY MOUTH DAILY Cox, Kirsten, MD  Active   HYDROcodone-acetaminophen (NORCO/VICODIN) 5-325 MG tablet [id number]  Take 1 tablet by mouth every 6 (six) hours as needed for moderate pain (pain score 4-6) (back pain). Cox, Kirsten, MD  Active   levothyroxine (SYNTHROID) 50 MCG tablet [id number] No TAKE 1 TABLET BY MOUTH DAILY Cox, Kirsten, MD Taking Active   liraglutide (VICTOZA) 18 MG/3ML SOPN [id number]  Start 0.6mg  SQ once a day for 7 days, then increase to 1.2mg  once a day Cox, Kirsten, MD  Active   lisinopril (ZESTRIL) 20 MG tablet [id number]  TAKE 1 TABLET BY MOUTH DAILY Cox, Kirsten, MD  Active   metFORMIN (GLUCOPHAGE) 500 MG tablet [id number]  TAKE 1 TABLET BY MOUTH TWO TIMES DAILY. Cox, Kirsten, MD  Active   methocarbamol (ROBAXIN) 750 MG tablet [id number]  TAKE 1 TABLET BY MOUTH FOUR TIMES DAILY. Cox, Kirsten, MD  Active   montelukast (SINGULAIR) 10 MG tablet [id number]  TAKE 1 TABLET BY MOUTH AT BEDTIME. Blane Ohara, MD  Active  Multiple Vitamins-Minerals (CENTRUM SILVER 50+MEN PO) [id number] No Take by mouth. [provider] Taking Active   omega-3 acid ethyl esters (LOVAZA) 1 g capsule [id number] No TAKE 2 CAPSULES BY MOUTH TWO TIMES DAILY Cox, Kirsten, MD Taking Active   pregabalin (LYRICA) 300 MG capsule [id number] No TAKE 1 CAPSULE BY MOUTH TWICE A DAY Cox, Kirsten, MD Taking Active   promethazine (PHENERGAN) 25 MG tablet [id number]  TAKE 1 TABLET BY MOUTH  EVERY 6 HOURS AS NEEDED FOR NAUSEA AND VOMITING Cox, Kirsten, MD  Active   Semaglutide,0.25 or 0.5MG /DOS, (OZEMPIC, 0.25 OR 0.5 MG/DOSE,) 2 MG/3ML SOPN [id number]  Inject 0.25 mg into the skin once a week. Cox, Kirsten, MD  Active   traZODone (DESYREL) 50 MG tablet [id number] No TAKE 1 TO 2 TABLETS AT BEDTIME Cox, Kirsten, MD Taking Active   valACYclovir (VALTREX) 1000 MG tablet [id number] No TAKE 1 TABLET BY MOUTH TWICE DAILY FOR 7 DAYS AS NEEDED FOR COLD SORES Cox, Kirsten, MD Taking Active   venlafaxine XR (EFFEXOR-XR) 150 MG 24 hr capsule [id number]  TAKE 2 CAPSULES BY MOUTH DAILY WITH BREAKFAST. Cox, Kirsten, MD  Active   vitamin B-12 (CYANOCOBALAMIN) 250 MCG tablet [id number] No Take 250 mcg by mouth daily. [provider] Taking Active             Home Care and Equipment/Supplies: Were Home Health Services Ordered?: NA Any new equipment or medical supplies ordered?: NA  Functional Questionnaire: Do you need assistance with bathing/showering or dressing?: No Do you need assistance with meal preparation?: No Do you need assistance with eating?: No Do you have difficulty maintaining continence: No Do you need assistance with getting out of bed/getting out of a chair/moving?: No Do you have difficulty managing or taking your medications?: No  Follow up appointments reviewed: PCP Follow-up appointment confirmed?: NA MD Provider Line Number:[phone] Given: No Specialist Hospital Follow-up appointment confirmed?: No Reason Specialist Follow-Up Not Confirmed: Patient has Specialist Provider Number and will Call for Appointment Do you need transportation to your follow-up appointment?: No Do you understand care options if your condition(s) worsen?: Yes-patient verbalized understanding    SIGNATURE Karena Addison, LPN [facility] Nurse Health Advisor Direct Dial [phone]

## 2023-09-20 DIAGNOSIS — S301XXA Contusion of abdominal wall, initial encounter: Secondary | ICD-10-CM | POA: Diagnosis not present

## 2023-10-18 ENCOUNTER — Other Ambulatory Visit: Payer: Self-pay | Admitting: Family Medicine

## 2023-11-01 ENCOUNTER — Other Ambulatory Visit: Payer: Self-pay | Admitting: Family Medicine

## 2023-11-01 DIAGNOSIS — E119 Type 2 diabetes mellitus without complications: Secondary | ICD-10-CM | POA: Diagnosis not present

## 2023-11-01 LAB — HM DIABETES EYE EXAM

## 2023-11-11 ENCOUNTER — Other Ambulatory Visit: Payer: Self-pay | Admitting: Family Medicine

## 2023-11-11 ENCOUNTER — Encounter: Payer: Self-pay | Admitting: Family Medicine

## 2023-11-17 ENCOUNTER — Other Ambulatory Visit: Payer: Self-pay | Admitting: Family Medicine

## 2023-11-17 NOTE — Progress Notes (Signed)
 Subjective:  Patient ID: Frederick Martinez, male    DOB: 07/12/1960  Age: 63 y.o. MRN: [id number]  Chief Complaint  Patient presents with   Medical Management of Chronic Issues    HPI: Diabetes:  Complications: hyperlipidemia, hypertension Glucose checking: infrequent. Only when feels bad.  Most recent A1C: 6.2% Current medications: Metformin  500 mg twice a day. Victoza  1.2 mg Frederick, Lyrica  300 mg twice Frederick.  Last Eye Exam: 02/17/2022 Foot checks: Frederick  CKD 3A: No kerendia  per nephrologist. Discontinued meloxicam .   Depression: Effexor  150 mg Frederick, Trazodone  50 mg at bedtime, Amitriptyline  25 mg Frederick, Wellbutrin  150 mg Frederick. Doing well. On clonazepam  0.5 mg twice Frederick. On lunesta  3 mg before bed on insomnia.  Hypertension: On lisinopril  20 mg Frederick.  Hyperlipidemia: On lipitor 10 mg before bed, zetia  10 mg Frederick, fenofibrate  `145 mg Frederick,   Hypothyroidism: levothyroxine  50 mcg once in am.   Chronic back pain: on vicodin one four times a day as needed - takes 1-2 Frederick usually, robaxin  750 mg four times a day.  GERD: On nexium  40 mg once Frederick and pepcid  40 mg twice Frederick.       11/18/2023    7:57 AM 03/11/2023    7:54 AM 11/05/2022    7:39 AM 11/05/2022    7:34 AM 07/02/2022    7:38 AM  Depression screen PHQ 2/9  Decreased Interest 0 0 0 0 0  Down, Depressed, Hopeless 0 0 0 0 0  PHQ - 2 Score 0 0 0 0 0  Altered sleeping 0      Tired, decreased energy 0      Change in appetite 0      Feeling bad or failure about yourself  0      Trouble concentrating 0      Moving slowly or fidgety/restless 0      Suicidal thoughts 0      PHQ-9 Score 0      Difficult doing work/chores Not difficult at all            11/18/2023    7:57 AM  Fall Risk   Falls in the past year? 0  Number falls in past yr: 0  Injury with Fall? 0  Risk for fall due to : No Fall Risks  Follow up Falls evaluation completed    Patient Care Team: Mercy Stall, MD as PCP - General (Family Medicine)    Review of Systems  Constitutional:  Negative for chills, diaphoresis, fatigue and fever.  HENT:  Negative for congestion, ear pain and sore throat.   Respiratory:  Negative for cough and shortness of breath.   Cardiovascular:  Negative for chest pain and leg swelling.  Gastrointestinal:  Negative for abdominal pain, constipation, diarrhea, nausea and vomiting.  Genitourinary:  Negative for dysuria and urgency.  Musculoskeletal:  Negative for arthralgias and myalgias.  Neurological:  Negative for dizziness and headaches.  Psychiatric/Behavioral:  Negative for dysphoric mood.     Current Outpatient Medications on File Prior to Visit  Medication Sig Dispense Refill   amitriptyline  (ELAVIL ) 25 MG tablet TAKE 1 TABLET BY MOUTH AT BEDTIME. 90 tablet 1   amLODipine  (NORVASC ) 10 MG tablet Take 1 tablet (10 mg total) by mouth Frederick. (Patient not taking: Reported on 07/22/2023) 90 tablet 0   Ascorbic Acid (VITAMIN C) 1000 MG tablet Take 1,000 mg by mouth Frederick.     atorvastatin  (LIPITOR) 10 MG tablet TAKE 1 TABLET BY MOUTH Frederick. 90 tablet 3  buPROPion  (WELLBUTRIN  XL) 150 MG 24 hr tablet TAKE 1 TABLET BY MOUTH Frederick 90 tablet 1   clonazePAM  (KLONOPIN ) 0.5 MG tablet TAKE 1 TABLET BY MOUTH TWICE Frederick 60 tablet 5   esomeprazole  (NEXIUM ) 40 MG capsule TAKE 1 CAPSULE BY MOUTH TWO TIMES Frederick BEFORE A MEAL 180 capsule 1   Eszopiclone  3 MG TABS TAKE 1 TABLET BY MOUTH ONCE Frederick AT BEDTIME -  TAKE  IMMEDIATELY  BEFORE  BEDTIME 90 tablet 0   ezetimibe  (ZETIA ) 10 MG tablet TAKE 1 TABLET BY MOUTH Frederick 90 tablet 1   famotidine  (PEPCID ) 40 MG tablet TAKE 1 TABLET BY MOUTH 2 TIMES Frederick. 180 tablet 2   fenofibrate  (TRICOR ) 145 MG tablet TAKE 1 TABLET BY MOUTH Frederick 90 tablet 1   HYDROcodone -acetaminophen  (NORCO/VICODIN) 5-325 MG tablet Take 1 tablet by mouth every 6 (six) hours as needed for moderate pain (pain score 4-6) (back pain). 40 tablet 0   levothyroxine  (SYNTHROID ) 50 MCG tablet TAKE 1 TABLET BY  MOUTH Frederick 90 tablet 1   lisinopril  (ZESTRIL ) 20 MG tablet TAKE 1 TABLET BY MOUTH Frederick 90 tablet 1   metFORMIN  (GLUCOPHAGE ) 500 MG tablet TAKE 1 TABLET BY MOUTH TWO TIMES Frederick. 180 tablet 3   methocarbamol  (ROBAXIN ) 750 MG tablet TAKE 1 TABLET BY MOUTH FOUR TIMES Frederick. 360 tablet 0   montelukast  (SINGULAIR ) 10 MG tablet TAKE 1 TABLET BY MOUTH AT BEDTIME. 90 tablet 3   Multiple Vitamins-Minerals (CENTRUM SILVER 50+MEN PO) Take by mouth.     omega-3 acid ethyl esters (LOVAZA ) 1 g capsule TAKE 2 CAPSULES BY MOUTH TWO TIMES Frederick 360 capsule 1   pregabalin  (LYRICA ) 300 MG capsule TAKE 1 CAPSULE BY MOUTH TWICE A DAY 180 capsule 1   promethazine (PHENERGAN) 25 MG tablet TAKE 1 TABLET BY MOUTH EVERY 6 HOURS AS NEEDED FOR NAUSEA AND VOMITING 60 tablet 2   traZODone  (DESYREL ) 50 MG tablet TAKE 1 TO 2 TABLETS AT BEDTIME 180 tablet 0   valACYclovir (VALTREX) 1000 MG tablet TAKE 1 TABLET BY MOUTH TWICE Frederick FOR 7 DAYS AS NEEDED FOR COLD SORES 14 tablet 0   venlafaxine  XR (EFFEXOR -XR) 150 MG 24 hr capsule TAKE 2 CAPSULES BY MOUTH Frederick WITH BREAKFAST. 180 capsule 1   vitamin B-12 (CYANOCOBALAMIN ) 250 MCG tablet Take 250 mcg by mouth Frederick.     No current facility-administered medications on file prior to visit.   Past Medical History:  Diagnosis Date   Acute coronary syndrome (HCC) 02/04/2020   Anxiety    Arthritis    Cancer (HCC)    kidney cancer   Depression    Diabetes mellitus without complication (HCC) 2011   type II   Essential hypertension    GERD (gastroesophageal reflux disease)    History of kidney stones    Mixed hyperlipidemia    Past Surgical History:  Procedure Laterality Date   CHOLECYSTECTOMY  2008   PARTIAL NEPHRECTOMY  2002   kidney cancer   SHOULDER SURGERY  2008    Family History  Problem Relation Age of Onset   Drug abuse Sister    Aortic stenosis Brother        aortic valve replacement: postop infection   Cancer Brother    Heart disease Other    Stroke Other     Hypertension Other    Diabetes Other    CAD Other    Social History   Socioeconomic History   Marital status: Married    Spouse name: Not  on file   Number of children: 2   Years of education: Not on file   Highest education level: 12th grade  Occupational History   Occupation: maintance  Tobacco Use   Smoking status: Never   Smokeless tobacco: Never  Substance and Sexual Activity   Alcohol use: Not Currently    Comment: has not drank since 2011   Drug use: No   Sexual activity: Not on file  Other Topics Concern   Not on file  Social History Narrative   Not on file   Social Drivers of Health   Financial Resource Strain: Low Risk  (07/21/2023)   Overall Financial Resource Strain (CARDIA)    Difficulty of Paying Living Expenses: Not hard at all  Food Insecurity: No Food Insecurity (07/21/2023)   Hunger Vital Sign    Worried About Running Out of Food in the Last Year: Never true    Ran Out of Food in the Last Year: Never true  Transportation Needs: No Transportation Needs (07/21/2023)   PRAPARE - Administrator, Civil Service (Medical): No    Lack of Transportation (Non-Medical): No  Physical Activity: Unknown (07/21/2023)   Exercise Vital Sign    Days of Exercise per Week: Patient declined    Minutes of Exercise per Session: Not on file  Stress: Patient Declined (07/21/2023)   [employer] of Occupational Health - Occupational Stress Questionnaire    Feeling of Stress : Patient declined  Social Connections: Unknown (07/21/2023)   Social Connection and Isolation Panel [NHANES]    Frequency of Communication with Friends and Family: Patient declined    Frequency of Social Gatherings with Friends and Family: Patient declined    Attends Religious Services: Patient declined    Database administrator or Organizations: Patient declined    Attends Engineer, structural: Not on file    Marital Status: Married    Objective:  BP 128/64   Pulse 81   Temp  97.8 F (36.6 C)   Ht 6\' 1"  (1.854 m)   Wt 270 lb (122.5 kg)   SpO2 92%   BMI 35.62 kg/m      11/18/2023    7:53 AM 07/22/2023    7:29 AM 03/11/2023    7:48 AM  BP/Weight  Systolic BP 128 112 106  Diastolic BP 64 62 58  Wt. (Lbs) 270 267 263  BMI 35.62 kg/m2 36.21 kg/m2 35.67 kg/m2    Physical Exam Vitals reviewed.  Constitutional:      Appearance: Normal appearance. He is obese.  Neck:     Vascular: No carotid bruit.  Cardiovascular:     Rate and Rhythm: Normal rate and regular rhythm.     Heart sounds: Normal heart sounds.  Pulmonary:     Effort: Pulmonary effort is normal.     Breath sounds: Normal breath sounds. No wheezing, rhonchi or rales.  Abdominal:     General: Bowel sounds are normal.     Palpations: Abdomen is soft.     Tenderness: There is no abdominal tenderness.  Neurological:     Mental Status: He is alert.  Psychiatric:        Mood and Affect: Mood normal.        Behavior: Behavior normal.     Diabetic Foot Exam - Simple   Simple Foot Form Diabetic Foot exam was performed with the following findings: Yes 11/18/2023  8:19 AM  Visual Inspection No deformities, no ulcerations, no other skin breakdown bilaterally:  Yes Sensation Testing See comments: Yes Pulse Check Posterior Tibialis and Dorsalis pulse intact bilaterally: Yes Comments Decreased sensation BL feet.       Lab Results  Component Value Date   WBC 4.6 11/18/2023   HGB 12.1 (L) 11/18/2023   HCT 37.1 (L) 11/18/2023   PLT 267 11/18/2023   GLUCOSE 105 (H) 11/18/2023   CHOL 145 11/18/2023   TRIG 185 (H) 11/18/2023   HDL 41 11/18/2023   LDLCALC 73 11/18/2023   ALT 51 (H) 11/18/2023   AST 51 (H) 11/18/2023   NA 139 11/18/2023   K 4.7 11/18/2023   CL 102 11/18/2023   CREATININE 1.23 11/18/2023   BUN 17 11/18/2023   CO2 22 11/18/2023   TSH 1.700 11/18/2023   INR 1.04 03/28/2014   HGBA1C 5.9 (H) 11/18/2023   MICROALBUR 30 12/27/2020      Assessment & Plan:  Type 2 diabetes  mellitus with polyneuropathy (HCC) Assessment & Plan: Control: good Recommend check sugars fasting Frederick. Recommend check feet Frederick. Recommend annual eye exams. Medicines: Continue Metformin  500 mg twice a day. Increase Victoza  1.8 mg Frederick, and Lyrica  300 mg twice Frederick.  Continue to work on eating a healthy diet and exercise.  Labs drawn today.     Orders: -     Hemoglobin A1c -     Liraglutide ; 1.8 MG SUBCUTANEOUSLY ONCE A DAY  Dispense: 6 mL; Refill: 0  Acquired hypothyroidism Assessment & Plan: Previously well controlled Continue Synthroid  at current dose  Recheck TSH and adjust Synthroid  as indicated    Orders: -     TSH  Hypertensive kidney disease with stage 3a chronic kidney disease (HCC) Assessment & Plan: Stable. No changes to medicines. Continue Lisinopril  20 mg Frederick. Continue to work on eating a healthy diet and exercise.  Labs drawn today.   Orders: -     CBC with Differential/Platelet -     Comprehensive metabolic panel with GFR  Mixed hyperlipidemia Assessment & Plan: Controlled Continue Fenofibrate  145 mg Frederick, Lovaza  1 g twice a day, Atorvastatin  10 mg Frederick, Zetia  10 mg Frederick. Continue to work on eating a healthy diet and exercise.  Labs drawn today.    Orders: -     Lipid panel  Mild recurrent major depression (HCC) Assessment & Plan: The current medical regimen is effective;  continue present plan and medications. Continue Effexor  150 mg Frederick, Trazodone  50 mg at bedtime, Amitriptyline  25 mg Frederick, Wellbutrin  150 mg Frederick.    Severe obesity with body mass index (BMI) of 35.0 to 39.9 with serious comorbidity [facility]) Assessment & Plan: Recommend continue to work on eating healthy diet and exercise. Comordities: diabetes.    Gastroesophageal reflux disease without esophagitis Assessment & Plan: The current medical regimen is effective;  continue present plan and medications. Continue Esomeprazole  40 mg Frederick, Famotidine  40 mg twice  Frederick   Chronic pain syndrome Assessment & Plan: OA of knees and lumbar spine.  Continue methocarbamol , amitriptyline .       Meds ordered this encounter  Medications   liraglutide  (VICTOZA ) 18 MG/3ML SOPN    Sig: 1.8 MG SUBCUTANEOUSLY ONCE A DAY    Dispense:  6 mL    Refill:  0    Orders Placed This Encounter  Procedures   CBC with Differential/Platelet   Hemoglobin A1c   Lipid panel   Comprehensive metabolic panel with GFR   TSH     Follow-up: Return in about 3 months (around 02/18/2024) for chronic fasting.  I,Marla I Leal-Borjas,acting as a scribe for Mercy Stall, MD.,have documented all relevant documentation on the behalf of Mercy Stall, MD,as directed by  Mercy Stall, MD while in the presence of Mercy Stall, MD.   An After Visit Summary was printed and given to the patient.  I attest that I have reviewed this visit and agree with the plan scribed by my staff.   Mercy Stall, MD [facility] [phone]

## 2023-11-18 ENCOUNTER — Ambulatory Visit: Payer: 59 | Admitting: Family Medicine

## 2023-11-18 VITALS — BP 128/64 | HR 81 | Temp 97.8°F | Ht 73.0 in | Wt 270.0 lb

## 2023-11-18 DIAGNOSIS — K219 Gastro-esophageal reflux disease without esophagitis: Secondary | ICD-10-CM | POA: Diagnosis not present

## 2023-11-18 DIAGNOSIS — N1831 Chronic kidney disease, stage 3a: Secondary | ICD-10-CM

## 2023-11-18 DIAGNOSIS — I129 Hypertensive chronic kidney disease with stage 1 through stage 4 chronic kidney disease, or unspecified chronic kidney disease: Secondary | ICD-10-CM

## 2023-11-18 DIAGNOSIS — F330 Major depressive disorder, recurrent, mild: Secondary | ICD-10-CM

## 2023-11-18 DIAGNOSIS — E039 Hypothyroidism, unspecified: Secondary | ICD-10-CM | POA: Diagnosis not present

## 2023-11-18 DIAGNOSIS — G894 Chronic pain syndrome: Secondary | ICD-10-CM

## 2023-11-18 DIAGNOSIS — E1142 Type 2 diabetes mellitus with diabetic polyneuropathy: Secondary | ICD-10-CM | POA: Diagnosis not present

## 2023-11-18 DIAGNOSIS — E782 Mixed hyperlipidemia: Secondary | ICD-10-CM | POA: Diagnosis not present

## 2023-11-18 MED ORDER — LIRAGLUTIDE 18 MG/3ML ~~LOC~~ SOPN: PEN_INJECTOR | SUBCUTANEOUS | 0 refills | Status: AC

## 2023-11-19 LAB — CBC WITH DIFFERENTIAL/PLATELET
Basophils Absolute: 0 10*3/uL (ref 0.0–0.2)
Basos: 1 %
EOS (ABSOLUTE): 0.3 10*3/uL (ref 0.0–0.4)
Eos: 5 %
Hematocrit: 37.1 % — ABNORMAL LOW (ref 37.5–51.0)
Hemoglobin: 12.1 g/dL — ABNORMAL LOW (ref 13.0–17.7)
Immature Grans (Abs): 0 10*3/uL (ref 0.0–0.1)
Immature Granulocytes: 0 %
Lymphocytes Absolute: 1.2 10*3/uL (ref 0.7–3.1)
Lymphs: 25 %
MCH: 31.1 pg (ref 26.6–33.0)
MCHC: 32.6 g/dL (ref 31.5–35.7)
MCV: 95 fL (ref 79–97)
Monocytes Absolute: 0.4 10*3/uL (ref 0.1–0.9)
Monocytes: 8 %
Neutrophils Absolute: 2.8 10*3/uL (ref 1.4–7.0)
Neutrophils: 61 %
Platelets: 267 10*3/uL (ref 150–450)
RBC: 3.89 x10E6/uL — ABNORMAL LOW (ref 4.14–5.80)
RDW: 12.4 % (ref 11.6–15.4)
WBC: 4.6 10*3/uL (ref 3.4–10.8)

## 2023-11-19 LAB — COMPREHENSIVE METABOLIC PANEL WITH GFR
ALT: 51 IU/L — ABNORMAL HIGH (ref 0–44)
AST: 51 IU/L — ABNORMAL HIGH (ref 0–40)
Albumin: 4.8 g/dL (ref 3.9–4.9)
Alkaline Phosphatase: 41 IU/L — ABNORMAL LOW (ref 44–121)
BUN/Creatinine Ratio: 14 (ref 10–24)
BUN: 17 mg/dL (ref 8–27)
Bilirubin Total: 0.3 mg/dL (ref 0.0–1.2)
CO2: 22 mmol/L (ref 20–29)
Calcium: 10.5 mg/dL — ABNORMAL HIGH (ref 8.6–10.2)
Chloride: 102 mmol/L (ref 96–106)
Creatinine, Ser: 1.23 mg/dL (ref 0.76–1.27)
Globulin, Total: 2.4 g/dL (ref 1.5–4.5)
Glucose: 105 mg/dL — ABNORMAL HIGH (ref 70–99)
Potassium: 4.7 mmol/L (ref 3.5–5.2)
Sodium: 139 mmol/L (ref 134–144)
Total Protein: 7.2 g/dL (ref 6.0–8.5)
eGFR: 67 mL/min/{1.73_m2} (ref >59)

## 2023-11-19 LAB — LIPID PANEL
Chol/HDL Ratio: 3.5 ratio (ref 0.0–5.0)
Cholesterol, Total: 145 mg/dL (ref 100–199)
HDL: 41 mg/dL (ref >39)
LDL Chol Calc (NIH): 73 mg/dL (ref 0–99)
Triglycerides: 185 mg/dL — ABNORMAL HIGH (ref 0–149)
VLDL Cholesterol Cal: 31 mg/dL (ref 5–40)

## 2023-11-19 LAB — HEMOGLOBIN A1C
Est. average glucose Bld gHb Est-mCnc: 123 mg/dL
Hgb A1c MFr Bld: 5.9 % — ABNORMAL HIGH (ref 4.8–5.6)

## 2023-11-19 LAB — TSH: TSH: 1.7 u[IU]/mL (ref 0.450–4.500)

## 2023-11-20 ENCOUNTER — Ambulatory Visit: Payer: Self-pay | Admitting: Family Medicine

## 2023-11-20 ENCOUNTER — Encounter: Payer: Self-pay | Admitting: Family Medicine

## 2023-11-20 HISTORY — DX: Morbid (severe) obesity due to excess calories: E66.01

## 2023-11-20 NOTE — Assessment & Plan Note (Addendum)
 Control: good Recommend check sugars fasting daily. Recommend check feet daily. Recommend annual eye exams. Medicines: Continue Metformin  500 mg twice a day. Increase Victoza  1.8 mg daily, and Lyrica  300 mg twice daily.  Continue to work on eating a healthy diet and exercise.  Labs drawn today.

## 2023-11-20 NOTE — Assessment & Plan Note (Signed)
 Previously well controlled Continue Synthroid at current dose  Recheck TSH and adjust Synthroid as indicated

## 2023-11-20 NOTE — Assessment & Plan Note (Signed)
 Recommend continue to work on eating healthy diet and exercise. Comordities: diabetes.

## 2023-11-20 NOTE — Assessment & Plan Note (Signed)
The current medical regimen is effective;  continue present plan and medications. Continue Effexor 150 mg daily, Trazodone 50 mg at bedtime, Amitriptyline 25 mg daily, Wellbutrin 150 mg daily.

## 2023-11-20 NOTE — Assessment & Plan Note (Signed)
 OA of knees and lumbar spine.  Continue methocarbamol , amitriptyline .

## 2023-11-20 NOTE — Assessment & Plan Note (Signed)
Controlled Continue Fenofibrate 145 mg daily, Lovaza 1 g twice a day, Atorvastatin 10 mg daily, Zetia 10 mg daily. Continue to work on eating a healthy diet and exercise.  Labs drawn today.

## 2023-11-20 NOTE — Assessment & Plan Note (Signed)
 Stable. No changes to medicines. Continue Lisinopril  20 mg daily. Continue to work on eating a healthy diet and exercise.  Labs drawn today.

## 2023-11-20 NOTE — Assessment & Plan Note (Signed)
 The current medical regimen is effective;  continue present plan and medications. Continue Esomeprazole  40 mg daily, Famotidine  40 mg twice daily

## 2023-11-28 ENCOUNTER — Other Ambulatory Visit: Payer: Self-pay | Admitting: Family Medicine

## 2023-12-01 ENCOUNTER — Other Ambulatory Visit: Payer: Self-pay

## 2023-12-07 ENCOUNTER — Ambulatory Visit: Admitting: Family Medicine

## 2023-12-08 ENCOUNTER — Ambulatory Visit: Admitting: Family Medicine

## 2023-12-19 DIAGNOSIS — L03114 Cellulitis of left upper limb: Secondary | ICD-10-CM | POA: Diagnosis not present

## 2024-02-27 ENCOUNTER — Other Ambulatory Visit: Payer: Self-pay | Admitting: Family Medicine

## 2024-02-28 ENCOUNTER — Other Ambulatory Visit: Payer: Self-pay | Admitting: Family Medicine

## 2024-03-22 NOTE — Assessment & Plan Note (Addendum)
 Blood pressure well-controlled with current regimen. - Continue lisinopril  20 mg once daily. - Continue amlodipine  for blood pressure management.

## 2024-03-22 NOTE — Assessment & Plan Note (Addendum)
 Previously well controlled Continue Synthroid at current dose

## 2024-03-22 NOTE — Assessment & Plan Note (Addendum)
 Controlled. - Continue Lovaza  1 gram, two capsules twice daily. - Continue fenofibrate  for cholesterol management. - Continue atorvastatin  for cholesterol management. - Obtain cholesterol levels from blood work. Orders:   Comprehensive metabolic panel with GFR   CBC with Differential/Platelet   Lipid panel

## 2024-03-22 NOTE — Assessment & Plan Note (Addendum)
 Diabetes well-controlled with A1c of 5.9%. Neuropathy managed with Lyrica  and amitriptyline . - Continue metformin  500 mg twice daily. - Continue Victoza  1.2 mg daily. - Continue Lyrica  300 mg twice daily. - Continue amitriptyline  for neuropathy. Orders:   POCT glycosylated hemoglobin (Hb A1C)

## 2024-03-22 NOTE — Progress Notes (Signed)
 Subjective:  Patient ID: Frederick Martinez, male    DOB: August 13, 1960  Age: 63 y.o. MRN: [id number]  Chief Complaint  Patient presents with   Medical Management of Chronic Issues    HPI: Discussed the use of AI scribe software for clinical note transcription with the patient, who gave verbal consent to proceed.  History of Present Illness Frederick Martinez is a 63 year old male with diabetes and hypertension who presents with knee and foot pain.  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 - No fevers, chills, sweats, earaches, sore throat, stuffy nose, or bowel problems  Chest pain and dyspnea - Intermittent chest pain lasting 10-15 minutes every 2-3 days - Chest pain typically occurs while working - Chest pain is accompanied by shortness of breath - No associated nausea - No chest pain while riding electric bicycle unless going downhill - History of cardiac catheterization in 2007 or 2010 - No recent stress test  Diabetes mellitus and neuropathy - Diabetes managed with metformin  500 mg twice daily and Victoza  1.2 mg daily - Recent hemoglobin A1c of 5.9% - Checks blood sugar occasionally when feeling unwell - Neuropathy managed with Lyrica  300 mg twice daily and amitriptyline   Hypertension and hyperlipidemia - Hypertension managed with lisinopril  20 mg once daily and amlodipine  - Hyperlipidemia managed with fenofibrate , atorvastatin , and prescription fish oil (Lovaza ) 1 gram, two capsules twice daily - Medications have remained consistent for a long time  Sleep disturbance and mood symptoms - Takes trazodone  for sleep - Occasional feelings of depression - No significant changes in appetite or concentration - No thoughts  of self-harm  Physical activity - Recently purchased an electric bicycle for exercise and pedals as well - No chest pain while riding unless going downhill       03/23/2024    7:53 AM 11/18/2023    7:57 AM 03/11/2023    7:54 AM 11/05/2022    7:39 AM 11/05/2022    7:34 AM  Depression screen PHQ 2/9  Decreased Interest 0 0 0 0 0  Down, Depressed, Hopeless 0 0 0 0 0  PHQ - 2 Score 0 0 0 0 0  Altered sleeping 0 0     Tired, decreased energy 1 0     Change in appetite 0 0     Feeling bad or failure about yourself  0 0     Trouble concentrating 0 0     Moving slowly or fidgety/restless 0 0     Suicidal thoughts 0 0     PHQ-9 Score 1 0     Difficult doing work/chores  Not difficult at all           11/18/2023    7:57 AM  Fall Risk   Falls in the past year? 0  Number falls in past yr: 0  Injury with Fall? 0  Risk for fall due to : No Fall Risks  Follow up Falls evaluation completed    Patient Care Team: Sherre Clapper, MD as PCP - General (Family Medicine)   Review of Systems  Constitutional:  Negative for appetite change, fatigue and fever.  HENT:  Negative for congestion, ear pain, sinus pressure and sore throat.   Eyes: Negative.  Respiratory:  Negative for cough, chest tightness, shortness of breath and wheezing.   Cardiovascular:  Positive for chest pain. Negative for palpitations.  Gastrointestinal:  Negative for abdominal pain, constipation, diarrhea, nausea and vomiting.  Endocrine: Negative.   Genitourinary:  Negative for dysuria, hematuria and urgency.  Musculoskeletal:  Positive for arthralgias. Negative for back pain, joint swelling and myalgias.  Skin:  Negative for rash.  Allergic/Immunologic: Negative.   Neurological:  Negative for dizziness, weakness, light-headedness and headaches.  Hematological: Negative.   Psychiatric/Behavioral:  Negative for dysphoric mood. The patient is not nervous/anxious.     Current Outpatient Medications on File Prior to Visit   Medication Sig Dispense Refill   amitriptyline  (ELAVIL ) 25 MG tablet TAKE 1 TABLET BY MOUTH AT BEDTIME. 90 tablet 1   amLODipine  (NORVASC ) 10 MG tablet Take 1 tablet (10 mg total) by mouth daily. 90 tablet 0   Ascorbic Acid (VITAMIN C) 1000 MG tablet Take 1,000 mg by mouth daily.     atorvastatin  (LIPITOR) 10 MG tablet TAKE 1 TABLET BY MOUTH DAILY. 90 tablet 3   buPROPion  (WELLBUTRIN  XL) 150 MG 24 hr tablet TAKE 1 TABLET BY MOUTH DAILY 90 tablet 1   clonazePAM  (KLONOPIN ) 0.5 MG tablet TAKE 1 TABLET BY MOUTH TWICE DAILY 60 tablet 5   esomeprazole  (NEXIUM ) 40 MG capsule TAKE 1 CAPSULE BY MOUTH TWO TIMES DAILY BEFORE A MEAL 180 capsule 1   Eszopiclone  3 MG TABS TAKE 1 TABLET BY MOUTH AT BEDTIME -  TAKE  IMMEDIATELY  BEFORE  BEDTIME 90 tablet 1   ezetimibe  (ZETIA ) 10 MG tablet TAKE 1 TABLET BY MOUTH DAILY 90 tablet 1   famotidine  (PEPCID ) 40 MG tablet TAKE 1 TABLET BY MOUTH 2 TIMES DAILY. 180 tablet 2   fenofibrate  (TRICOR ) 145 MG tablet TAKE 1 TABLET BY MOUTH DAILY 90 tablet 1   HYDROcodone -acetaminophen  (NORCO/VICODIN) 5-325 MG tablet Take 1 tablet by mouth every 6 (six) hours as needed for moderate pain (pain score 4-6) (back pain). 40 tablet 0   levothyroxine  (SYNTHROID ) 50 MCG tablet TAKE 1 TABLET BY MOUTH DAILY 90 tablet 1   liraglutide  (VICTOZA ) 18 MG/3ML SOPN 1.8 MG SUBCUTANEOUSLY ONCE A DAY 6 mL 0   lisinopril  (ZESTRIL ) 20 MG tablet TAKE 1 TABLET BY MOUTH DAILY 90 tablet 1   metFORMIN  (GLUCOPHAGE ) 500 MG tablet TAKE 1 TABLET BY MOUTH TWO TIMES DAILY. 180 tablet 3   methocarbamol  (ROBAXIN ) 750 MG tablet TAKE 1 TABLET BY MOUTH FOUR TIMES DAILY. 360 tablet 0   montelukast  (SINGULAIR ) 10 MG tablet TAKE 1 TABLET BY MOUTH AT BEDTIME. 90 tablet 3   Multiple Vitamins-Minerals (CENTRUM SILVER 50+MEN PO) Take by mouth.     omega-3 acid ethyl esters (LOVAZA ) 1 g capsule TAKE 2 CAPSULES BY MOUTH TWO TIMES DAILY 360 capsule 1   pregabalin  (LYRICA ) 300 MG capsule TAKE 1 CAPSULE BY MOUTH TWICE A DAY 180  capsule 1   promethazine (PHENERGAN) 25 MG tablet TAKE 1 TABLET BY MOUTH EVERY 6 HOURS AS NEEDED FOR NAUSEA AND VOMITING 60 tablet 2   traZODone  (DESYREL ) 50 MG tablet TAKE 1 TO 2 TABLETS AT BEDTIME 180 tablet 3   valACYclovir (VALTREX) 1000 MG tablet TAKE 1 TABLET BY MOUTH TWICE DAILY FOR 7 DAYS AS NEEDED FOR COLD SORES 14 tablet 0   venlafaxine  XR (EFFEXOR -XR) 150 MG 24 hr capsule TAKE 2 CAPSULES BY MOUTH DAILY WITH BREAKFAST. 180 capsule 1   vitamin B-12 (CYANOCOBALAMIN ) 250 MCG tablet Take 250 mcg by mouth  daily.     No current facility-administered medications on file prior to visit.   Past Medical History:  Diagnosis Date   Acute coronary syndrome (HCC) 02/04/2020   Anxiety    Arthritis    Cancer (HCC)    kidney cancer   Depression    Diabetes mellitus without complication (HCC) 2011   type II   Essential hypertension    GERD (gastroesophageal reflux disease)    History of kidney stones    Mixed hyperlipidemia    Other chest pain 03/23/2024   Past Surgical History:  Procedure Laterality Date   CHOLECYSTECTOMY  2008   PARTIAL NEPHRECTOMY  2002   kidney cancer   SHOULDER SURGERY  2008    Family History  Problem Relation Age of Onset   Drug abuse Sister    Aortic stenosis Brother        aortic valve replacement: postop infection   Cancer Brother    Heart disease Other    Stroke Other    Hypertension Other    Diabetes Other    CAD Other    Social History   Socioeconomic History   Marital status: Married    Spouse name: Not on file   Number of children: 2   Years of education: Not on file   Highest education level: 12th grade  Occupational History   Occupation: maintance  Tobacco Use   Smoking status: Never   Smokeless tobacco: Never  Substance and Sexual Activity   Alcohol use: Not Currently    Comment: has not drank since 2011   Drug use: No   Sexual activity: Not on file  Other Topics Concern   Not on file  Social History Narrative   Not on file    Social Drivers of Health   Financial Resource Strain: Low Risk  (07/21/2023)   Overall Financial Resource Strain (CARDIA)    Difficulty of Paying Living Expenses: Not hard at all  Food Insecurity: No Food Insecurity (07/21/2023)   Hunger Vital Sign    Worried About Running Out of Food in the Last Year: Never true    Ran Out of Food in the Last Year: Never true  Transportation Needs: No Transportation Needs (07/21/2023)   PRAPARE - Administrator, Civil Service (Medical): No    Lack of Transportation (Non-Medical): No  Physical Activity: Unknown (07/21/2023)   Exercise Vital Sign    Days of Exercise per Week: Patient declined    Minutes of Exercise per Session: Not on file  Stress: Patient Declined (07/21/2023)   [employer] of Occupational Health - Occupational Stress Questionnaire    Feeling of Stress : Patient declined  Social Connections: Unknown (07/21/2023)   Social Connection and Isolation Panel    Frequency of Communication with Friends and Family: Patient declined    Frequency of Social Gatherings with Friends and Family: Patient declined    Attends Religious Services: Patient declined    Database administrator or Organizations: Patient declined    Attends Engineer, structural: Not on file    Marital Status: Married    Objective:  BP 128/62   Pulse 67   Temp 98 F (36.7 C) (Temporal)   Resp 18   Ht 6' 1 (1.854 m)   Wt 263 lb 9.6 oz (119.6 kg)   SpO2 95%   BMI 34.78 kg/m      03/23/2024    7:37 AM 11/18/2023    7:53 AM 07/22/2023  7:29 AM  BP/Weight  Systolic BP 128 128 112  Diastolic BP 62 64 62  Wt. (Lbs) 263.6 270 267  BMI 34.78 kg/m2 35.62 kg/m2 36.21 kg/m2    Physical Exam Vitals reviewed.  Constitutional:      Appearance: Normal appearance. He is obese.  Neck:     Vascular: No carotid bruit.  Cardiovascular:     Rate and Rhythm: Normal rate and regular rhythm.     Pulses: Normal pulses.     Heart sounds: Normal heart sounds.   Pulmonary:     Effort: Pulmonary effort is normal.     Breath sounds: Normal breath sounds. No wheezing, rhonchi or rales.  Abdominal:     General: Bowel sounds are normal.     Palpations: Abdomen is soft.     Tenderness: There is no abdominal tenderness.  Neurological:     Mental Status: He is alert and oriented to person, place, and time.  Psychiatric:        Mood and Affect: Mood normal.        Behavior: Behavior normal.      Diabetic foot exam was performed with the following findings:   No deformities, ulcerations, or other skin breakdown Normal sensation of 10g monofilament Intact posterior tibialis and dorsalis pedis pulses      Lab Results  Component Value Date   WBC 4.6 11/18/2023   HGB 12.1 (L) 11/18/2023   HCT 37.1 (L) 11/18/2023   PLT 267 11/18/2023   GLUCOSE 105 (H) 11/18/2023   CHOL 145 11/18/2023   TRIG 185 (H) 11/18/2023   HDL 41 11/18/2023   LDLCALC 73 11/18/2023   ALT 51 (H) 11/18/2023   AST 51 (H) 11/18/2023   NA 139 11/18/2023   K 4.7 11/18/2023   CL 102 11/18/2023   CREATININE 1.23 11/18/2023   BUN 17 11/18/2023   CO2 22 11/18/2023   TSH 1.700 11/18/2023   INR 1.04 03/28/2014   HGBA1C 5.9 (A) 03/23/2024    Results for orders placed or performed in visit on 03/23/24  POCT glycosylated hemoglobin (Hb A1C)   Collection Time: 03/23/24  7:57 AM  Result Value Ref Range   Hemoglobin A1C 5.9 (A) 4.0 - 5.6 %   HbA1c POC (<> result, manual entry)     HbA1c, POC (prediabetic range)     HbA1c, POC (controlled diabetic range)    .  Assessment & Plan:   Assessment & Plan Hypertensive kidney disease with stage 3a chronic kidney disease (HCC) Blood pressure well-controlled with current regimen. - Continue lisinopril  20 mg once daily. - Continue amlodipine  for blood pressure management.    Mixed hyperlipidemia Controlled. - Continue Lovaza  1 gram, two capsules twice daily. - Continue fenofibrate  for cholesterol management. - Continue  atorvastatin  for cholesterol management. - Obtain cholesterol levels from blood work. Orders:   Comprehensive metabolic panel with GFR   CBC with Differential/Platelet   Lipid panel  Type 2 diabetes mellitus with polyneuropathy (HCC) Diabetes well-controlled with A1c of 5.9%. Neuropathy managed with Lyrica  and amitriptyline . - Continue metformin  500 mg twice daily. - Continue Victoza  1.2 mg daily. - Continue Lyrica  300 mg twice daily. - Continue amitriptyline  for neuropathy. Orders:   POCT glycosylated hemoglobin (Hb A1C)  Acquired hypothyroidism Previously well controlled Continue Synthroid  at current dose      Other chest pain Occurs every 2-3 days, associated with activity, shortness of breath present. Further evaluation needed. Ordered troponin. Came back normal.  Refer to cardiology.  The 10-year  ASCVD risk score (Arnett DK, et al., 2019) is: 17.5%   Values used to calculate the score:     Age: 72 years     Clincally relevant sex: Male     Is Non-Hispanic African American: No     Diabetic: Yes     Tobacco smoker: No     Systolic Blood Pressure: 128 mmHg     Is BP treated: Yes     HDL Cholesterol: 41 mg/dL     Total Cholesterol: 145 mg/dL  Orders:   EKG 87-Ozji   EKG   Ambulatory referral to Cardiology  Chronic pain of left knee Order xray.  Orders:   DG Knee Complete 4 Views Left; Future  Chronic pain of right knee Order xray.  Orders:   DG Knee Complete 4 Views Right; Future  Encounter for immunization  Orders:   Flu vaccine, recombinant, trivalent, inj  Encounter for immunization  Orders:   Pfizer Comirnaty Covid-19 Vaccine 44yrs & older    Body mass index is 34.78 kg/m.     No orders of the defined types were placed in this encounter.   Orders Placed This Encounter  Procedures   DG Knee Complete 4 Views Left   DG Knee Complete 4 Views Right   Flu vaccine, recombinant, trivalent, inj   Pfizer Comirnaty Covid-19 Vaccine 56yrs &  older   Comprehensive metabolic panel with GFR   CBC with Differential/Platelet   Lipid panel   POCT glycosylated hemoglobin (Hb A1C)   EKG 12-Lead   EKG    Total time spent on today's visit was 40 minutes, including both face-to-face time and nonface-to-face time personally spent on review of chart (labs and imaging), discussing labs and goals, discussing further work-up, treatment options, referrals to specialist if needed, reviewing outside records of pertinent, answering patient's questions, and coordinating care.    Follow-up: Return in about 4 months (around 07/24/2024) for chronic follow up.  An After Visit Summary was printed and given to the patient.  Abigail Free, MD [facility] [phone]

## 2024-03-23 ENCOUNTER — Encounter: Payer: Self-pay | Admitting: Family Medicine

## 2024-03-23 ENCOUNTER — Ambulatory Visit: Admitting: Family Medicine

## 2024-03-23 VITALS — BP 128/62 | HR 67 | Temp 98.0°F | Resp 18 | Ht 73.0 in | Wt 263.6 lb

## 2024-03-23 DIAGNOSIS — I129 Hypertensive chronic kidney disease with stage 1 through stage 4 chronic kidney disease, or unspecified chronic kidney disease: Secondary | ICD-10-CM

## 2024-03-23 DIAGNOSIS — E1142 Type 2 diabetes mellitus with diabetic polyneuropathy: Secondary | ICD-10-CM | POA: Diagnosis not present

## 2024-03-23 DIAGNOSIS — N1831 Chronic kidney disease, stage 3a: Secondary | ICD-10-CM

## 2024-03-23 DIAGNOSIS — G8929 Other chronic pain: Secondary | ICD-10-CM

## 2024-03-23 DIAGNOSIS — M25562 Pain in left knee: Secondary | ICD-10-CM

## 2024-03-23 DIAGNOSIS — R0789 Other chest pain: Secondary | ICD-10-CM | POA: Diagnosis not present

## 2024-03-23 DIAGNOSIS — M25561 Pain in right knee: Secondary | ICD-10-CM

## 2024-03-23 DIAGNOSIS — Z23 Encounter for immunization: Secondary | ICD-10-CM

## 2024-03-23 DIAGNOSIS — E039 Hypothyroidism, unspecified: Secondary | ICD-10-CM

## 2024-03-23 DIAGNOSIS — E782 Mixed hyperlipidemia: Secondary | ICD-10-CM | POA: Diagnosis not present

## 2024-03-23 LAB — LAB REPORT - SCANNED: EGFR: 57

## 2024-03-23 LAB — POCT GLYCOSYLATED HEMOGLOBIN (HGB A1C): Hemoglobin A1C: 5.9 % — AB (ref 4.0–5.6)

## 2024-03-26 NOTE — Assessment & Plan Note (Signed)
 Occurs every 2-3 days, associated with activity, shortness of breath present. Further evaluation needed. Ordered troponin. Came back normal.  Refer to cardiology.  The 10-year ASCVD risk score (Arnett DK, et al., 2019) is: 17.5%   Values used to calculate the score:     Age: 63 years     Clincally relevant sex: Male     Is Non-Hispanic African American: No     Diabetic: Yes     Tobacco smoker: No     Systolic Blood Pressure: 128 mmHg     Is BP treated: Yes     HDL Cholesterol: 41 mg/dL     Total Cholesterol: 145 mg/dL  Orders:   EKG 87-Ozji   EKG   Ambulatory referral to Cardiology

## 2024-03-26 NOTE — Assessment & Plan Note (Signed)
 Order xray.  Orders:   DG Knee Complete 4 Views Right; Future

## 2024-03-26 NOTE — Assessment & Plan Note (Signed)
 Order xray.  Orders:   DG Knee Complete 4 Views Left; Future

## 2024-03-29 ENCOUNTER — Telehealth: Payer: Self-pay

## 2024-03-29 NOTE — Telephone Encounter (Signed)
 PA submitted and approved via covermymeds for eszopiclone .

## 2024-03-30 ENCOUNTER — Other Ambulatory Visit: Payer: Self-pay | Admitting: Family Medicine

## 2024-04-03 DIAGNOSIS — I10 Essential (primary) hypertension: Secondary | ICD-10-CM | POA: Insufficient documentation

## 2024-04-03 DIAGNOSIS — F419 Anxiety disorder, unspecified: Secondary | ICD-10-CM | POA: Insufficient documentation

## 2024-04-03 DIAGNOSIS — M1990 Unspecified osteoarthritis, unspecified site: Secondary | ICD-10-CM | POA: Insufficient documentation

## 2024-04-03 DIAGNOSIS — Z87442 Personal history of urinary calculi: Secondary | ICD-10-CM | POA: Insufficient documentation

## 2024-04-03 DIAGNOSIS — C801 Malignant (primary) neoplasm, unspecified: Secondary | ICD-10-CM | POA: Insufficient documentation

## 2024-04-03 DIAGNOSIS — K219 Gastro-esophageal reflux disease without esophagitis: Secondary | ICD-10-CM | POA: Insufficient documentation

## 2024-04-05 NOTE — Progress Notes (Deleted)
 Cardiology Office Note:    Date:  04/05/2024   ID:  Frederick Martinez, DOB [birth year], MRN [id number]  PCP:  Sherre Clapper, MD  Cardiologist:  Redell Leiter, MD   Referring MD: Sherre Clapper, MD  ASSESSMENT:    No diagnosis found. PLAN:    In order of problems listed above:  ***  Next appointment   Medication Adjustments/Labs and Tests Ordered: Current medicines are reviewed at length with the patient today.  Concerns regarding medicines are outlined above.  No orders of the defined types were placed in this encounter.  No orders of the defined types were placed in this encounter.    No chief complaint on file. ***  History of Present Illness:    Frederick Martinez is a 63 y.o. male with a history of diabetes and hypertension Dr. Sherial who is being seen today for the evaluation of chest pain at the request of Cox, Kirsten, MD.  He had left heart catheterization [facility] April 2019 had normal coronary angiography and normal ejection fraction 55%.  The Past Medical History:  Diagnosis Date   Acquired hypothyroidism 12/27/2020   Anxiety    Arthritis    Cancer (HCC)    kidney cancer   Chronic midline low back pain without sciatica 03/11/2023   Chronic pain of left knee 11/25/2019   Chronic pain of right knee 11/25/2019   Chronic pain syndrome 07/03/2022   Diabetes mellitus without complication (HCC) 2011   type II   Essential hypertension    Gastroesophageal reflux disease without esophagitis 01/28/2020   GERD (gastroesophageal reflux disease)    History of kidney stones    Hypertensive CKD (chronic kidney disease) 04/03/2021   Lumbosacral spondylosis 03/30/2010   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   Mixed hyperlipidemia    Need for shingles vaccine 03/13/2023   Other chest pain 03/23/2024   Routine medical exam 11/08/2022   Seasonal allergic rhinitis due to pollen 10/11/2021   Severe obesity with body mass index (BMI) of  35.0 to 39.9 with serious comorbidity (HCC) 11/20/2023   Type 2 diabetes mellitus with neurologic complication, without long-term current use of insulin (HCC) 09/06/2017   Type 2 diabetes mellitus with polyneuropathy (HCC) 11/07/2019    Past Surgical History:  Procedure Laterality Date   CHOLECYSTECTOMY  2008   PARTIAL NEPHRECTOMY  2002   kidney cancer   SHOULDER SURGERY  2008    Current Medications: No outpatient medications have been marked as taking for the 04/06/24 encounter (Appointment) with Leiter Redell PARAS, MD.     Allergies:   Patient has no known allergies.   Social History   Socioeconomic History   Marital status: Married    Spouse name: Not on file   Number of children: 2   Years of education: Not on file   Highest education level: 12th grade  Occupational History   Occupation: maintance  Tobacco Use   Smoking status: Never   Smokeless tobacco: Never  Substance and Sexual Activity   Alcohol use: Not Currently    Comment: has not drank since 2011   Drug use: No   Sexual activity: Not on file  Other Topics Concern   Not on file  Social History Narrative   Not on file   Social Drivers of Health   Financial Resource Strain: Low Risk  (07/21/2023)   Overall Financial Resource Strain (CARDIA)    Difficulty of Paying Living Expenses: Not hard at all  Food Insecurity: No  Food Insecurity (07/21/2023)   Hunger Vital Sign    Worried About Running Out of Food in the Last Year: Never true    Ran Out of Food in the Last Year: Never true  Transportation Needs: No Transportation Needs (07/21/2023)   PRAPARE - Administrator, Civil Service (Medical): No    Lack of Transportation (Non-Medical): No  Physical Activity: Unknown (07/21/2023)   Exercise Vital Sign    Days of Exercise per Week: Patient declined    Minutes of Exercise per Session: Not on file  Stress: Patient Declined (07/21/2023)   [employer] of Occupational Health - Occupational Stress  Questionnaire    Feeling of Stress : Patient declined  Social Connections: Unknown (07/21/2023)   Social Connection and Isolation Panel    Frequency of Communication with Friends and Family: Patient declined    Frequency of Social Gatherings with Friends and Family: Patient declined    Attends Religious Services: Patient declined    Database administrator or Organizations: Patient declined    Attends Engineer, structural: Not on file    Marital Status: Married     Family History: The patient's ***family history includes Aortic stenosis in his brother; CAD in an other family member; Cancer in his brother; Diabetes in an other family member; Drug abuse in his sister; Heart disease in an other family member; Hypertension in an other family member; Stroke in an other family member.  ROS:   ROS Please see the history of present illness.    *** All other systems reviewed and are negative.  EKGs/Labs/Other Studies Reviewed:    The following studies were reviewed today: ***      EKG:  EKG is *** ordered today.  The ekg ordered today is personally reviewed and demonstrates ***  Recent Labs: 11/18/2023: ALT 51; BUN 17; Creatinine, Ser 1.23; Hemoglobin 12.1; Platelets 267; Potassium 4.7; Sodium 139; TSH 1.700  Recent Lipid Panel    Component Value Date/Time   CHOL 145 11/18/2023 0829   TRIG 185 (H) 11/18/2023 0829   HDL 41 11/18/2023 0829   CHOLHDL 3.5 11/18/2023 0829   LDLCALC 73 11/18/2023 0829    Physical Exam:    VS:  There were no vitals taken for this visit.    Wt Readings from Last 3 Encounters:  03/23/24 263 lb 9.6 oz (119.6 kg)  11/18/23 270 lb (122.5 kg)  07/22/23 267 lb (121.1 kg)     GEN: *** Well nourished, well developed in no acute distress HEENT: Normal NECK: No JVD; No carotid bruits LYMPHATICS: No lymphadenopathy CARDIAC: ***RRR, no murmurs, rubs, gallops RESPIRATORY:  Clear to auscultation without rales, wheezing or rhonchi  ABDOMEN: Soft,  non-tender, non-distended MUSCULOSKELETAL:  No edema; No deformity  SKIN: Warm and dry NEUROLOGIC:  Alert and oriented x 3 PSYCHIATRIC:  Normal affect     Signed, Redell Leiter, MD  04/05/2024 1:25 PM    [facility]

## 2024-04-06 ENCOUNTER — Ambulatory Visit: Admitting: Cardiology

## 2024-04-17 ENCOUNTER — Other Ambulatory Visit: Payer: Self-pay | Admitting: Family Medicine

## 2024-04-19 NOTE — Progress Notes (Deleted)
 Cardiology Office Note:    Date:  04/20/2024   ID:  Rainer Mounce, DOB 31-Dec-1960, MRN [id number]  PCP:  Sherre Clapper, MD  Cardiologist:  Redell Leiter, MD   Referring MD: Sherre Clapper, MD  ASSESSMENT:    1. Chest pain of uncertain etiology   2. Essential hypertension   3. Mixed hyperlipidemia   4. Type 2 diabetes mellitus with polyneuropathy (HCC)    PLAN:    In order of problems listed above:  ***  Next appointment   Medication Adjustments/Labs and Tests Ordered: Current medicines are reviewed at length with the patient today.  Concerns regarding medicines are outlined above.  No orders of the defined types were placed in this encounter.  No orders of the defined types were placed in this encounter.    No chief complaint on file. ***  History of Present Illness:    Frederick Martinez is a 63 y.o. male with a history of diabetes and hypertension and dyslipidemia who is being seen today for the evaluation of chest pain at the request of Cox, Kirsten, MD.  He had left heart catheterization [facility] April 2019 had normal coronary angiography and normal ejection fraction 55%.    Recent GFR 4 weeks ago 57 cc/min June lipid panel had a cholesterol 145 LDL 73 non-HDL cholesterol 104 hemoglobin was 12.1 platelets normal 267,000 A1c 5.9% and CMP with a creatinine 1.23 GFR 67 cc/min mild abnormality of AST and ALT nonspecific. Past Medical History:  Diagnosis Date   Acquired hypothyroidism 12/27/2020   Anxiety    Arthritis    Cancer (HCC)    kidney cancer   Chronic midline low back pain without sciatica 03/11/2023   Chronic pain of left knee 11/25/2019   Chronic pain of right knee 11/25/2019   Chronic pain syndrome 07/03/2022   Diabetes mellitus without complication (HCC) 2011   type II   Essential hypertension    Gastroesophageal reflux disease without esophagitis 01/28/2020   GERD (gastroesophageal reflux disease)    History of kidney  stones    Hypertensive CKD (chronic kidney disease) 04/03/2021   Lumbosacral spondylosis 03/30/2010   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   Mixed hyperlipidemia    Need for shingles vaccine 03/13/2023   Other chest pain 03/23/2024   Routine medical exam 11/08/2022   Seasonal allergic rhinitis due to pollen 10/11/2021   Severe obesity with body mass index (BMI) of 35.0 to 39.9 with serious comorbidity (HCC) 11/20/2023   Type 2 diabetes mellitus with neurologic complication, without long-term current use of insulin (HCC) 09/06/2017   Type 2 diabetes mellitus with polyneuropathy (HCC) 11/07/2019    Past Surgical History:  Procedure Laterality Date   CHOLECYSTECTOMY  2008   PARTIAL NEPHRECTOMY  2002   kidney cancer   SHOULDER SURGERY  2008    Current Medications: No outpatient medications have been marked as taking for the 04/20/24 encounter (Appointment) with Leiter Redell PARAS, MD.     Allergies:   Patient has no known allergies.   Social History   Socioeconomic History   Marital status: Married    Spouse name: Not on file   Number of children: 2   Years of education: Not on file   Highest education level: 12th grade  Occupational History   Occupation: maintance  Tobacco Use   Smoking status: Never   Smokeless tobacco: Never  Substance and Sexual Activity   Alcohol use: Not Currently    Comment: has not drank  since 2011   Drug use: No   Sexual activity: Not on file  Other Topics Concern   Not on file  Social History Narrative   Not on file   Social Drivers of Health   Financial Resource Strain: Low Risk  (07/21/2023)   Overall Financial Resource Strain (CARDIA)    Difficulty of Paying Living Expenses: Not hard at all  Food Insecurity: No Food Insecurity (07/21/2023)   Hunger Vital Sign    Worried About Running Out of Food in the Last Year: Never true    Ran Out of Food in the Last Year: Never true  Transportation Needs: No Transportation Needs (07/21/2023)    PRAPARE - Administrator, Civil Service (Medical): No    Lack of Transportation (Non-Medical): No  Physical Activity: Unknown (07/21/2023)   Exercise Vital Sign    Days of Exercise per Week: Patient declined    Minutes of Exercise per Session: Not on file  Stress: Patient Declined (07/21/2023)   [employer] of Occupational Health - Occupational Stress Questionnaire    Feeling of Stress : Patient declined  Social Connections: Unknown (07/21/2023)   Social Connection and Isolation Panel    Frequency of Communication with Friends and Family: Patient declined    Frequency of Social Gatherings with Friends and Family: Patient declined    Attends Religious Services: Patient declined    Database Administrator or Organizations: Patient declined    Attends Engineer, Structural: Not on file    Marital Status: Married     Family History: The patient's ***family history includes Aortic stenosis in his brother; CAD in an other family member; Cancer in his brother; Diabetes in an other family member; Drug abuse in his sister; Heart disease in an other family member; Hypertension in an other family member; Stroke in an other family member.  ROS:   ROS Please see the history of present illness.    *** All other systems reviewed and are negative.  EKGs/Labs/Other Studies Reviewed:    The following studies were reviewed today: ***      EKG:  EKG is *** ordered today.  The ekg ordered today is personally reviewed and demonstrates ***  Recent Labs: 11/18/2023: ALT 51; BUN 17; Creatinine, Ser 1.23; Hemoglobin 12.1; Platelets 267; Potassium 4.7; Sodium 139; TSH 1.700  Recent Lipid Panel    Component Value Date/Time   CHOL 145 11/18/2023 0829   TRIG 185 (H) 11/18/2023 0829   HDL 41 11/18/2023 0829   CHOLHDL 3.5 11/18/2023 0829   LDLCALC 73 11/18/2023 0829    Physical Exam:    VS:  There were no vitals taken for this visit.    Wt Readings from Last 3 Encounters:   03/23/24 263 lb 9.6 oz (119.6 kg)  11/18/23 270 lb (122.5 kg)  07/22/23 267 lb (121.1 kg)     GEN: *** Well nourished, well developed in no acute distress HEENT: Normal NECK: No JVD; No carotid bruits LYMPHATICS: No lymphadenopathy CARDIAC: ***RRR, no murmurs, rubs, gallops RESPIRATORY:  Martinez to auscultation without rales, wheezing or rhonchi  ABDOMEN: Soft, non-tender, non-distended MUSCULOSKELETAL:  No edema; No deformity  SKIN: Warm and dry NEUROLOGIC:  Alert and oriented x 3 PSYCHIATRIC:  Normal affect     Signed, Redell Leiter, MD  04/20/2024 1:29 PM    [facility]

## 2024-04-20 ENCOUNTER — Ambulatory Visit: Attending: Cardiology | Admitting: Cardiology

## 2024-04-23 NOTE — Progress Notes (Signed)
 Frederick Martinez                                          MRN: [id number]   04/23/2024   The [facility] Quality Team Specialist reviewed this patient medical record for the purposes of chart review for care gap closure. The following were reviewed: abstraction for care gap closure-glycemic status assessment.    [facility] Quality Team

## 2024-04-30 ENCOUNTER — Other Ambulatory Visit: Payer: Self-pay | Admitting: Family Medicine

## 2024-05-28 ENCOUNTER — Other Ambulatory Visit: Payer: Self-pay | Admitting: Family Medicine

## 2024-06-01 ENCOUNTER — Other Ambulatory Visit: Payer: Self-pay | Admitting: Family Medicine

## 2024-07-27 ENCOUNTER — Ambulatory Visit: Payer: Self-pay | Admitting: Family Medicine

## 2024-07-27 DIAGNOSIS — E1142 Type 2 diabetes mellitus with diabetic polyneuropathy: Secondary | ICD-10-CM

## 2024-07-27 DIAGNOSIS — E782 Mixed hyperlipidemia: Secondary | ICD-10-CM
